# Patient Record
Sex: Female | Born: 1985 | Race: Black or African American | Hispanic: No | Marital: Single | Smoking: Never smoker
Health system: Southern US, Community
[De-identification: ages and names within clinical notes are randomized; demographics above are authoritative.]

## PROBLEM LIST (undated history)

## (undated) DIAGNOSIS — F329 Major depressive disorder, single episode, unspecified: Secondary | ICD-10-CM

## (undated) DIAGNOSIS — F32A Depression, unspecified: Secondary | ICD-10-CM

---

## 2013-03-23 ENCOUNTER — Ambulatory Visit (INDEPENDENT_AMBULATORY_CARE_PROVIDER_SITE_OTHER): Payer: 59 | Admitting: Internal Medicine

## 2013-03-23 VITALS — BP 102/75 | HR 72 | Temp 98.2°F | Resp 18 | Ht 67.5 in | Wt 360.2 lb

## 2013-03-23 DIAGNOSIS — L03111 Cellulitis of right axilla: Secondary | ICD-10-CM

## 2013-03-23 DIAGNOSIS — Z6841 Body Mass Index (BMI) 40.0 and over, adult: Secondary | ICD-10-CM

## 2013-03-23 DIAGNOSIS — IMO0002 Reserved for concepts with insufficient information to code with codable children: Secondary | ICD-10-CM

## 2013-03-23 DIAGNOSIS — L732 Hidradenitis suppurativa: Secondary | ICD-10-CM

## 2013-03-23 MED ORDER — DOXYCYCLINE HYCLATE 100 MG PO TABS: 100.0000 mg | ORAL_TABLET | Freq: Two times a day (BID) | ORAL | Status: DC

## 2013-03-23 NOTE — Progress Notes (Signed)
  Subjective:    Patient ID: Gabrielle Golden, female    DOB: 1985/09/26, 27 y.o.   MRN: [id number]  HPIprogressive swelling /pain/some pus d/c R axilla over last 4 wks Hx same L ax 12-18 mos ago req I&D---ok since  No htn or DM Morbidly large   Review of Systems No fever    Objective:   Physical Exam BP 102/75  Pulse 72  Temp(Src) 98.2 F (36.8 C)  Resp 18  Ht 5' 7.5" (1.715 m)  Wt 360 lb 3.2 oz (163.386 kg)  BMI 55.55 kg/m2  SpO2 100%  LMP 12/13/2012 Matted connected abscesses R axillae with one large area 2cmx2cm very tender 3 pointing areas but no spontan pus   See proc= S Weber      Assessment & Plan:  BMI 50.0-59.9, adult  Hidradenitis suppurativa of right axilla  Meds ordered this encounter  Medications  . doxycycline (VIBRA-TABS) 100 MG tablet    Sig: Take 1 tablet (100 mg total) by mouth 2 (two) times daily.    Dispense:  20 tablet    Refill:  0   F/u as disc

## 2013-03-26 ENCOUNTER — Ambulatory Visit (INDEPENDENT_AMBULATORY_CARE_PROVIDER_SITE_OTHER): Payer: 59 | Admitting: Physician Assistant

## 2013-03-26 VITALS — BP 120/80 | HR 81 | Temp 98.1°F | Resp 16 | Ht 67.5 in | Wt 360.0 lb

## 2013-03-26 DIAGNOSIS — L732 Hidradenitis suppurativa: Secondary | ICD-10-CM

## 2013-03-26 DIAGNOSIS — L0291 Cutaneous abscess, unspecified: Secondary | ICD-10-CM

## 2013-03-26 NOTE — Progress Notes (Signed)
   Patient ID: Gabrielle Golden MRN: [id number], DOB: 1986/01/13 27 y.o. Date of Encounter: 03/26/2013, 4:22 PM  Primary Physician: Pcp Not In System  Chief Complaint: Wound care   See previous note  HPI: 27 y.o. female presents for wound care s/p I&D on 03/24/13 Doing well No issues or complaints Afebrile/ no chills No nausea or vomiting Tolerating Doxycycline Pain improving Daily dressing change Previous note reviewed  History reviewed. No pertinent past medical history.   Home Meds: Prior to Admission medications   Medication Sig Start Date End Date Taking? Authorizing Provider  doxycycline (VIBRA-TABS) 100 MG tablet Take 1 tablet (100 mg total) by mouth 2 (two) times daily. 03/23/13  Yes Tonye Pearson, MD    Allergies:  Allergies  Allergen Reactions  . Septra [Sulfamethoxazole-Tmp Ds] Hives    ROS: Constitutional: Afebrile, no chills Cardiovascular: negative for chest pain or palpitations Dermatological: Positive for wound and improving pain. Negative for erythema or warmth  GI: No nausea or vomiting   EXAM: Physical Exam: Blood pressure 120/80, pulse 81, temperature 98.1 F (36.7 C), temperature source Oral, resp. rate 16, height 5' 7.5" (1.715 m), weight 360 lb (163.295 kg), last menstrual period 03/25/2013, SpO2 100.00%., Body mass index is 55.52 kg/(m^2). General: Well developed, well nourished, in no acute distress. Nontoxic appearing. Head: Normocephalic, atraumatic, sclera non-icteric.  Neck: Supple. Lungs: Breathing is unlabored. Heart: Normal rate. Skin:  Warm and moist. Dressing and packing in place. Small amount of induration and TTP. No erythema. Neuro: Alert and oriented X 3. Moves all extremities spontaneously. Normal gait.  Psych:  Responds to questions appropriately with a normal affect.   PROCEDURE: Dressing removed Packing removed x 2 No purulence expressed x 2 Wound bed healthy x 2 Irrigated with 1% plain lidocaine 5 cc x 2 Repacked  with 1/4 plain packing x 2 Dressing applied  LAB: Culture: Few GPC in pairs and and chains. Few GNR.   A/P: 27 y.o. female with cellulitis/abscess as above s/p I&D on 03/24/13 -Wound care per above -Continue Doxycycline -Pain well controlled -Daily dressing changes -Recheck 48 hours  Signed, Eula Listen, PA-C [facility] [place], [place] [address] [phone] 03/26/2013 4:22 PM

## 2013-03-28 ENCOUNTER — Ambulatory Visit (INDEPENDENT_AMBULATORY_CARE_PROVIDER_SITE_OTHER): Payer: 59 | Admitting: Physician Assistant

## 2013-03-28 VITALS — BP 120/72 | HR 63 | Temp 98.2°F | Resp 18 | Ht 67.5 in | Wt 357.0 lb

## 2013-03-28 DIAGNOSIS — IMO0002 Reserved for concepts with insufficient information to code with codable children: Secondary | ICD-10-CM

## 2013-03-28 DIAGNOSIS — L02419 Cutaneous abscess of limb, unspecified: Secondary | ICD-10-CM

## 2013-03-28 NOTE — Progress Notes (Signed)
Patient ID: Gabrielle Golden MRN: [id number], DOB: 1986-01-18 27 y.o. Date of Encounter: 03/28/2013, 1:48 PM  Chief Complaint: Wound care   See previous note  HPI: 27 y.o. y/o female presents for wound care s/p I&D x 2 on 03/23/13. She had 2 separate abscesses of right axilla Doing well No issues or complaints Afebrile/ no chills No nausea or vomiting Tolerating doxycycline.  Pain improved.  Daily dressing change Previous note reviewed  History reviewed. No pertinent past medical history.   Home Meds: Prior to Admission medications   Medication Sig Start Date End Date Taking? Authorizing Provider  doxycycline (VIBRA-TABS) 100 MG tablet Take 1 tablet (100 mg total) by mouth 2 (two) times daily. 03/23/13  Yes Tonye Pearson, MD    Allergies:  Allergies  Allergen Reactions  . Septra [Sulfamethoxazole-Tmp Ds] Hives    ROS: Constitutional: Afebrile, no chills Cardiovascular: negative for chest pain or palpitations Dermatological: Positive for wound. Negative for erythema, pain, or warmth.  GI: No nausea or vomiting   EXAM: Physical Exam: Blood pressure 120/72, pulse 63, temperature 98.2 F (36.8 C), temperature source Oral, resp. rate 18, height 5' 7.5" (1.715 m), weight 357 lb (161.934 kg), last menstrual period 03/25/2013, SpO2 98.00%., Body mass index is 55.06 kg/(m^2). General: Well developed, well nourished, in no acute distress. Nontoxic appearing. Head: Normocephalic, atraumatic, sclera non-icteric.  Neck: Supple. Lungs: Breathing is unlabored. Heart: Normal rate. Skin:  Warm and moist. Dressing and packing in place. No induration, erythema, or tenderness to palpation. Neuro: Alert and oriented X 3. Moves all extremities spontaneously. Normal gait.  Psych:  Responds to questions appropriately with a normal affect.       PROCEDURE: Dressing and packing removed. No purulence expressed Both wound beds healthy Both irrigated with 1% plain lidocaine 5 cc and  repacked with 1/4 plain packing.  Dressing applied  LAB: Culture: pending  A/P: 27 y.o. y/o female with axillary cellulitis/abscess as above s/p I&D on 03/23/13 Wound care per above Continue doxycycline.  Pain well controlled Daily dressing changes Recheck 48 hours  Signed, Rhoderick Moody, PA-C 03/28/2013 1:48 PM

## 2013-03-29 LAB — WOUND CULTURE: Gram Stain: NONE SEEN

## 2013-03-30 ENCOUNTER — Ambulatory Visit (INDEPENDENT_AMBULATORY_CARE_PROVIDER_SITE_OTHER): Payer: 59 | Admitting: Physician Assistant

## 2013-03-30 VITALS — BP 120/64 | HR 77 | Temp 98.1°F | Resp 18 | Wt 360.0 lb

## 2013-03-30 DIAGNOSIS — L732 Hidradenitis suppurativa: Secondary | ICD-10-CM

## 2013-03-30 NOTE — Progress Notes (Signed)
   Patient ID: Gabrielle Golden MRN: [id number], DOB: 10-Feb-1986 27 y.o. Date of Encounter: 03/30/2013, 12:36 PM  Primary Physician: Pcp Not In System  Chief Complaint: Wound care   See previous note  HPI: 27 y.o. female presents for wound care s/p I&D on 03/23/13 Doing well No issues or complaints Afebrile/ no chills No nausea or vomiting Tolerating doxycycline Pain resolved Daily dressing change Packing came out of both wounds at some point between 03/28/13 and today Previous note reviewed  History reviewed. No pertinent past medical history.   Home Meds: Prior to Admission medications   Medication Sig Start Date End Date Taking? Authorizing Provider  doxycycline (VIBRA-TABS) 100 MG tablet Take 1 tablet (100 mg total) by mouth 2 (two) times daily. 03/23/13  Yes Tonye Pearson, MD    Allergies:  Allergies  Allergen Reactions  . Septra [Sulfamethoxazole-Tmp Ds] Hives    ROS: Constitutional: Afebrile, no chills Cardiovascular: negative for chest pain or palpitations Dermatological: Positive for wound. Negative for erythema, pain, or warmth  GI: No nausea or vomiting   EXAM: Physical Exam: Blood pressure 120/64, pulse 77, temperature 98.1 F (36.7 C), temperature source Oral, resp. rate 18, weight 360 lb (163.295 kg), last menstrual period 03/25/2013, SpO2 99.00%., Body mass index is 55.52 kg/(m^2). General: Well developed, well nourished, in no acute distress. Nontoxic appearing. Head: Normocephalic, atraumatic, sclera non-icteric.  Neck: Supple. Lungs: Breathing is unlabored. Heart: Normal rate. Skin:  Warm and moist. No dressing or packing in place. No induration, erythema, or tenderness to palpation. Inferior wound resolved. Superior wound still open.  Neuro: Alert and oriented X 3. Moves all extremities spontaneously. Normal gait.  Psych:  Responds to questions appropriately with a normal affect.   PROCEDURE: Dressing removed No purulence expressed Wound  bed healthy Irrigated with 1% plain lidocaine 5 cc. Repacked with 1/4 inch plain packing Dressing applied  LAB: Culture: No growth 2 days  A/P: 27 y.o. female with improving cellulitis/abscess as above s/p I&D on 03/23/13 -Wound care per above -Continue doxycycline -Pain well controlled -Daily dressing changes -Recheck 48 hours  Signed, Eula Listen, PA-C [facility] [place], [place] [address] [phone] 03/30/2013 12:36 PM

## 2013-04-01 ENCOUNTER — Ambulatory Visit (INDEPENDENT_AMBULATORY_CARE_PROVIDER_SITE_OTHER): Payer: 59 | Admitting: Physician Assistant

## 2013-04-01 VITALS — BP 110/70 | HR 80 | Temp 99.8°F | Resp 20 | Ht 67.0 in | Wt 356.2 lb

## 2013-04-01 DIAGNOSIS — L732 Hidradenitis suppurativa: Secondary | ICD-10-CM

## 2013-04-01 DIAGNOSIS — IMO0002 Reserved for concepts with insufficient information to code with codable children: Secondary | ICD-10-CM

## 2013-04-01 DIAGNOSIS — L02419 Cutaneous abscess of limb, unspecified: Secondary | ICD-10-CM

## 2013-04-01 NOTE — Progress Notes (Signed)
  Subjective:    Patient ID: Gabrielle Golden, female    DOB: Nov 19, 1985, 27 y.o.   MRN: [id number]  HPI  This 27 y.o. female presents for evaluation of axillary abscess s/p I&D 03/23/2013. This is her 4th visit for wound care. Thinks the packing may have fallen out. Has about 2 days left of the doxycycline.  Tolerating it well with food.  No fever/chills.  Pain is very much improved.  Review of Systems As above.    Objective:   Physical Exam BP 110/70  Pulse 80  Temp(Src) 99.8 F (37.7 C) (Oral)  Resp 20  Ht 5\' 7"  (1.702 m)  Wt 356 lb 3.2 oz (161.571 kg)  BMI 55.78 kg/m2  SpO2 98%  LMP 03/25/2013 A&O x 3. Packing removed.  Wound very shallow, with beefy red tissue.  No exudate or purulence.  Some surrounding induration consistent with recent cellulitis and chronic hidradenitis.  Non-tender. No packing placed.  Adhesive dressing applied.       Assessment & Plan:  Axillary abscess  - resolved.  Continue local wound care. Anticipatory guidance provided.  Hidradenitis suppurativa of right axilla  RTC prn.  Fernande Bras, PA-C Physician Assistant-Certified [facility]

## 2013-04-01 NOTE — Patient Instructions (Signed)
Complete the antibiotic

## 2013-09-24 ENCOUNTER — Ambulatory Visit (INDEPENDENT_AMBULATORY_CARE_PROVIDER_SITE_OTHER): Payer: 59 | Admitting: Family Medicine

## 2013-09-24 VITALS — BP 126/74 | HR 88 | Temp 98.2°F | Resp 18 | Ht 66.5 in | Wt 359.0 lb

## 2013-09-24 DIAGNOSIS — L0390 Cellulitis, unspecified: Secondary | ICD-10-CM

## 2013-09-24 DIAGNOSIS — L0291 Cutaneous abscess, unspecified: Secondary | ICD-10-CM

## 2013-09-24 DIAGNOSIS — M79629 Pain in unspecified upper arm: Secondary | ICD-10-CM

## 2013-09-24 DIAGNOSIS — M79609 Pain in unspecified limb: Secondary | ICD-10-CM

## 2013-09-24 DIAGNOSIS — L732 Hidradenitis suppurativa: Secondary | ICD-10-CM

## 2013-09-24 MED ORDER — DOXYCYCLINE HYCLATE 100 MG PO TABS: 100.0000 mg | ORAL_TABLET | Freq: Two times a day (BID) | ORAL | Status: DC

## 2013-09-24 NOTE — Progress Notes (Addendum)
Subjective:    Patient ID: Gabrielle Golden, female    DOB: [birth year], 28 y.o.   MRN: [id number]  HPI  This chart was scribed for Daveda Larock-MD by Smiley HousemanFallon Davis, Scribe. This patient was seen in room 9 and the patient's care was started at 4:28 PM.  HPI Comments: Gabrielle Golden is a 28 y.o. female who [facility] complaining of multiple abscesses that she noticed about 2 weeks ago.  She states she was informed to only return if the abscesses came to a head, but states they have become extremely painful.  She denies fevers, chills, and generalized myalgias.  Pt has h/o hidradenitis and was last seen in October 2014 for a similar complaint.  She states she began having abscesses sporadically as a child, but they have increased and worsened over the past year.  Pt denies visiting a dermatologist for this complaint.  Pt denies any other medical conditions.  She is allergic to Septra.  Pt denies taking any medicaitons daily.  Pt states she is in school at [employer].  [place] originally from [place]  She has been applying warm compresses.  History reviewed. No pertinent past surgical history.  Family History  Problem Relation Age of Onset  . Cancer Maternal Grandfather     History   Social History  . Marital Status: Single    Spouse Name: N/A    Number of Children: N/A  . Years of Education: N/A   Occupational History  . Not on file.   Social History Main Topics  . Smoking status: Never Smoker   . Smokeless tobacco: Never Used  . Alcohol Use: No  . Drug Use: No  . Sexual Activity: Not on file   Other Topics Concern  . Not on file   Social History Narrative  . No narrative on file    Allergies  Allergen Reactions  . Septra [Sulfamethoxazole-Tmp Ds] Hives    Patient Active Problem List   Diagnosis Date Noted  . BMI 50.0-59.9, adult 03/23/2013  . Hidradenitis suppurativa of right axilla 03/23/2013     Review of Systems  Constitutional:  Negative for fever, chills, diaphoresis and fatigue.  Respiratory: Negative for cough and shortness of breath.   Cardiovascular: Negative for chest pain.  Gastrointestinal: Negative for nausea, vomiting, abdominal pain and diarrhea.  Musculoskeletal: Negative for myalgias.  Skin: Positive for wound. Negative for color change and rash.       Multiple abscesses.    Neurological: Negative for headaches.  Psychiatric/Behavioral: Negative for behavioral problems and confusion.       Objective:   Physical Exam  Nursing note and vitals reviewed. Constitutional: She is oriented to person, place, and time. She appears well-developed and well-nourished. No distress.  HENT:  Head: Normocephalic and atraumatic.  Eyes: EOM are normal.  Neck: Neck supple. No tracheal deviation present.  Cardiovascular: Normal rate.   Pulmonary/Chest: Effort normal. No respiratory distress.  Musculoskeletal: Normal range of motion.  Neurological: She is alert and oriented to person, place, and time.  Skin: Skin is warm and dry. No rash noted.  2.5cm x 2 cm area of fluctuance and induration in left axilla.  1 cm area of induration at the medial left breast 3 cm by 1 cm of induration, fluctuance, and tenderness in right axilla.    Psychiatric: She has a normal mood and affect. Her behavior is normal.    Filed Vitals:   09/24/13 1624  BP: 126/74  Pulse: 88  Temp: 98.2 F (36.8 C)  TempSrc: Oral  Resp: 18  Height: 5' 6.5" (1.689 m)  Weight: 359 lb (162.841 kg)  SpO2: 99%   DIAGNOSTIC STUDIES: Oxygen Saturation is 99% on RA, normal by my interpretation.    COORDINATION OF CARE: 4:40 PM-Patient informed of current plan of treatment and evaluation and agrees with plan.    PROCEDURE NOTE: SEE SEPARATE PROCEDURE NOTE BY RYAN DUNN, PA-C.    Assessment & Plan:  Pain in axilla  Abscess and cellulitis  Hidradenitis suppurativa of right axilla  1. Pain B axilla:  New.  Secondary to recurrent  infection. 2.  Abscess/cellulitis B axilla:  Recurrent; s/p I&D with packing; RTC 48 hours for follow-up.  Rx for Doxycycline 100mg  bid x 2 weeks provided. Apply warm compresses for 15-20 minutes bid. 3.  Hidradenitis suppurativa B axilla:  Recurrent issue; rx for Doxycycline provided to take for 15 days; if recurs, advised to restart Doxycycline with onset of symptoms.  Refill of Doxycycline provided.  Meds ordered this encounter  Medications  . doxycycline (VIBRA-TABS) 100 MG tablet    Sig: Take 1 tablet (100 mg total) by mouth 2 (two) times daily.    Dispense:  60 tablet    Refill:  1   I personally performed the services described in this documentation, which was scribed in my presence.  The recorded information has been reviewed and is accurate.  Nilda SimmerKristi Briyah Wheelwright, M.D.  [facility]  [place] [address] [place], [place]  [address] [phone] phone [phone] fax

## 2013-09-24 NOTE — Progress Notes (Signed)
   Patient ID: Morene AntuMonet Michon MRN: [id number], DOB: 1986/01/22, 28 y.o. Date of Encounter: 09/24/2013, 5:44 PM    PROCEDURE NOTE: Verbal consent obtained. Risks and benefits of the procedure were explained to the patient. Patient made an informed decision to proceed with the procedure. Betadine prep per usual protocol. Local anesthesia obtained with 2% plain lidocaine 2 cc.   1 cm incision made with 11 blade along each lesion. 1 lesion along each axilla was lanced.   Culture taken. Scant purulence expressed. Lesion explored revealing no loculations. Irrigated with NS.  Packed with 1/4 inch plain packing.  Dressed. Wound care instructions including precautions with patient. Warm compresses.  Patient tolerated the procedure well. Recheck in 48 hours.  Lesion along the anterior chest and posterior chest, patient to apply warm compresses.        Signed, Eula Listenyan Dunn, MHS, PA-C [facility] [place], [place] [phone] [facility] 09/24/2013 5:44 PM

## 2013-09-24 NOTE — Patient Instructions (Signed)
1. Take Doxycycline 100mg  one tablet twice daily for two weeks and then stop. 2.  If symptoms recur in the future, start Doxycycline 100mg  twice daily for two weeks ago.  Start Doxycycline with onset of symptoms to hopefully avoid needing to have lanced.

## 2013-09-26 ENCOUNTER — Ambulatory Visit (INDEPENDENT_AMBULATORY_CARE_PROVIDER_SITE_OTHER): Payer: 59 | Admitting: Physician Assistant

## 2013-09-26 VITALS — BP 122/84 | HR 79 | Temp 98.6°F | Resp 16 | Ht 66.5 in | Wt 357.0 lb

## 2013-09-26 DIAGNOSIS — L0390 Cellulitis, unspecified: Principal | ICD-10-CM

## 2013-09-26 DIAGNOSIS — L732 Hidradenitis suppurativa: Secondary | ICD-10-CM

## 2013-09-26 DIAGNOSIS — L0291 Cutaneous abscess, unspecified: Secondary | ICD-10-CM

## 2013-09-26 NOTE — Progress Notes (Signed)
   Subjective:    Patient ID: Gabrielle Golden, female    DOB: 06-09-1986, 28 y.o.   MRN: [id number]  HPI   Gabrielle Golden is a very pleasant 28 yr old female here for wound care following I&D of two axillary abscess on 09/24/13.  She reports she is doing well.  Still having drainage and tenderness. The packing has fallen out of the Right axillary lesion.  She is taking the abx and tolerating it well.  No fever, chills, NV.   Review of Systems  Constitutional: Negative for fever and chills.  Respiratory: Negative.   Cardiovascular: Negative.   Gastrointestinal: Negative.   Skin: Positive for wound.       Objective:   Physical Exam  Vitals reviewed. Constitutional: She is oriented to person, place, and time. She appears well-developed and well-nourished. No distress.  HENT:  Head: Normocephalic and atraumatic.  Eyes: Conjunctivae are normal. No scleral icterus.  Pulmonary/Chest: Effort normal.  Neurological: She is alert and oriented to person, place, and time.  Skin: Skin is warm and dry.  Packing not in place in right axilla; packing removed from left axilla; no drainage expressed from either wound; not repacked as both wounds are very small; dressing applied  Psychiatric: She has a normal mood and affect. Her behavior is normal.        Assessment & Plan:  Cellulitis and abscess  Axillary hidradenitis suppurativa   Gabrielle Golden is a pleasant 28 yr old female here for wound care.  I have not repacked either wound today.  Continue daily dressing changes until no longer draining.  Continue hot compresses.  Finish abx as directed.  RTC if concerns.    Loleta DickerE. Elizabeth Jeovani Weisenburger MHS, PA-C [facility] 4/18/20156:40 PM

## 2014-01-15 ENCOUNTER — Ambulatory Visit (INDEPENDENT_AMBULATORY_CARE_PROVIDER_SITE_OTHER): Payer: BC Managed Care – PPO | Admitting: Family Medicine

## 2014-01-15 VITALS — BP 122/80 | HR 73 | Temp 98.8°F | Resp 16 | Ht 67.0 in | Wt 357.6 lb

## 2014-01-15 DIAGNOSIS — M79609 Pain in unspecified limb: Secondary | ICD-10-CM

## 2014-01-15 DIAGNOSIS — Z131 Encounter for screening for diabetes mellitus: Secondary | ICD-10-CM

## 2014-01-15 DIAGNOSIS — M79621 Pain in right upper arm: Secondary | ICD-10-CM

## 2014-01-15 DIAGNOSIS — L732 Hidradenitis suppurativa: Secondary | ICD-10-CM

## 2014-01-15 LAB — POCT GLYCOSYLATED HEMOGLOBIN (HGB A1C): Hemoglobin A1C: 5.1

## 2014-01-15 MED ORDER — HYDROCODONE-ACETAMINOPHEN 5-325 MG PO TABS: 1.0000 | ORAL_TABLET | Freq: Four times a day (QID) | ORAL | Status: DC | PRN

## 2014-01-15 NOTE — Progress Notes (Signed)
Chief Complaint:  Chief Complaint  Patient presents with  . abcess underrt arm    1 /1/2 weeks ago    HPI: Gabrielle Golden is a 28 y.o. female who is here for 1.5 weeks of  Right axillary pain , worsening sxs in last 3 days she has been taking doxycycline and putting warm compresses on it without relief. She had this before and Gabrielle FurbishElizabeth Egan PA-C had given her a refill n doxy to use, she has also tried warm compresses without relief. She has never been tested for diabetes. She has severe pain.   History reviewed. No pertinent past medical history. History reviewed. No pertinent past surgical history. History   Social History  . Marital Status: Single    Spouse Name: N/A    Number of Children: N/A  . Years of Education: N/A   Social History Main Topics  . Smoking status: Never Smoker   . Smokeless tobacco: Never Used  . Alcohol Use: No  . Drug Use: No  . Sexual Activity: None   Other Topics Concern  . 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  From [place].   Family History  Problem Relation Age of Onset  . Cancer Maternal Grandfather    Allergies  Allergen Reactions  . Septra [Sulfamethoxazole-Tmp Ds] Hives   Prior to Admission medications   Medication Sig Start Date End Date Taking? Authorizing Provider  doxycycline (VIBRA-TABS) 100 MG tablet Take 1 tablet (100 mg total) by mouth 2 (two) times daily. 09/24/13  Yes Ethelda ChickKristi M Smith, MD     ROS: The patient denies fevers, chills, night sweats, unintentional weight loss, chest pain, palpitations, wheezing, dyspnea on exertion, nausea, vomiting, abdominal pain, dysuria, hematuria, melena, numbness, weakness, or tingling.   All other systems have been reviewed and were otherwise negative with the exception of those mentioned in the HPI and as above.    PHYSICAL EXAM: Filed Vitals:   01/15/14 1550  BP: 122/80  Pulse: 73  Temp: 98.8 F (37.1 C)  Resp: 16    Filed Vitals:   01/15/14 1550  Height: 5\' 7"  (1.702 m)  Weight: 357 lb 9.6 oz (162.206 kg)   Body mass index is 55.99 kg/(m^2).  General: Alert, no acute distress, obese AA female HEENT:  Normocephalic, atraumatic, oropharynx patent. EOMI, PERRLA. Cardiovascular:  Regular rate and rhythm, no rubs murmurs or gallops.  No Carotid bruits, radial pulse intact. No pedal edema.  Respiratory: Clear to auscultation bilaterally.  No wheezes, rales, or rhonchi.  No cyanosis, no use of accessory musculature GI: No organomegaly, abdomen is soft and non-tender, positive bowel sounds.  No masses. Skin: + right axilla tenderness, warmth, minimal fluctuance, maybe lobulated but hard to tell.  Neurologic: Facial musculature symmetric. Psychiatric: Patient is appropriate throughout our interaction. Lymphatic: No cervical lymphadenopathy Musculoskeletal: Gait intact.   LABS: Results for orders placed in visit on 01/15/14  POCT GLYCOSYLATED HEMOGLOBIN (HGB A1C)      Result Value Ref Range   Hemoglobin A1C 5.1       EKG/XRAY:   Primary read interpreted by [facility].   ASSESSMENT/PLAN: Encounter Diagnoses  Name Primary?  . Hidradenitis axillaris Yes  . Axillary pain, right   . Screening for diabetes mellitus    Continue with doxycycline Rx Norco for pain F/u prn  Gross sideeffects, risk and benefits, and alternatives of medications d/w patient. Patient is aware that all medications  have potential sideeffects and we are unable to predict every sideeffect or drug-drug interaction that may occur.  Hamilton Capri PHUONG, DO 01/15/2014 4:53 PM

## 2014-01-15 NOTE — Progress Notes (Signed)
Patient ID: Gabrielle Golden MRN: [id number], DOB: June 10, 1986, 28 y.o. Date of Encounter: 01/15/2014, 4:33 PM    PROCEDURE NOTE: Verbal consent obtained. Local anesthesia obtained with 1% lidocaine with epinephrine.  1 cm incision made with 11 blade along lesion.  No purulence expressed. Lesion explored revealing no loculations. Not packed.  Dressed. Wound care instructions including precautions with patient. Patient tolerated the procedure well. Recheck as needed.      Grier MittsSigned, Rayen Dafoe, PA-C 01/15/2014 4:33 PM

## 2014-01-15 NOTE — Patient Instructions (Signed)
Hidradenitis Suppurativa, Sweat Gland Abscess Hidradenitis suppurativa is a long lasting (chronic), uncommon disease of the sweat glands. With this, boil-like lumps and scarring develop in the groin, some times under the arms (axillae), and under the breasts. It may also uncommonly occur behind the ears, in the crease of the buttocks, and around the genitals.  CAUSES  The cause is from a blocking of the sweat glands. They then become infected. It may cause drainage and odor. It is not contagious. So it cannot be given to someone else. It most often shows up in puberty (about 10 to 26 years of age). But it may happen much later. It is similar to acne which is a disease of the sweat glands. This condition is slightly more common in African-Americans and women. SYMPTOMS   Hidradenitis usually starts as one or more red, tender, swellings in the groin or under the arms (axilla).  Over a period of hours to days the lesions get larger. They often open to the skin surface, draining clear to yellow-colored fluid.  The infected area heals with scarring. DIAGNOSIS  Your caregiver makes this diagnosis by looking at you. Sometimes cultures (growing germs on plates in the lab) may be taken. This is to see what germ (bacterium) is causing the infection.  TREATMENT   Topical germ killing medicine applied to the skin (antibiotics) are the treatment of choice. Antibiotics taken by mouth (systemic) are sometimes needed when the condition is getting worse or is severe.  Avoid tight-fitting clothing which traps moisture in.  Dirt does not cause hidradenitis and it is not caused by poor hygiene.  Involved areas should be cleaned daily using an antibacterial soap. Some patients find that the liquid form of Lever 2000, applied to the involved areas as a lotion after bathing, can help reduce the odor related to this condition.  Sometimes surgery is needed to drain infected areas or remove scarred tissue. Removal of  large amounts of tissue is used only in severe cases.  Birth control pills may be helpful.  Oral retinoids (vitamin A derivatives) for 6 to 12 months which are effective for acne may also help this condition.  Weight loss will improve but not cure hidradenitis. It is made worse by being overweight. But the condition is not caused by being overweight.  This condition is more common in people who have had acne.  It may become worse under stress. There is no medical cure for hidradenitis. It can be controlled, but not cured. The condition usually continues for years with periods of getting worse and getting better (remission). Document Released: 01/10/2004 Document Revised: 08/20/2011 Document Reviewed: 08/28/2013 ExitCare Patient Information 2015 [employer]. This information is not intended to replace advice given to you by your health care provider. Make sure you discuss any questions you have with your health care provider.  

## 2016-04-25 ENCOUNTER — Other Ambulatory Visit: Payer: Self-pay | Admitting: Nurse Practitioner

## 2016-04-25 DIAGNOSIS — R10811 Right upper quadrant abdominal tenderness: Secondary | ICD-10-CM

## 2016-04-25 DIAGNOSIS — R10812 Left upper quadrant abdominal tenderness: Secondary | ICD-10-CM

## 2016-05-02 ENCOUNTER — Other Ambulatory Visit: Payer: Self-pay

## 2016-05-11 ENCOUNTER — Ambulatory Visit
Admission: RE | Admit: 2016-05-11 | Discharge: 2016-05-11 | Disposition: A | Discharge status: Home / Self Care | Payer: BLUE CROSS/BLUE SHIELD | Source: Ambulatory Visit | Attending: Nurse Practitioner | Admitting: Nurse Practitioner

## 2016-05-11 DIAGNOSIS — R10812 Left upper quadrant abdominal tenderness: Secondary | ICD-10-CM

## 2016-05-11 DIAGNOSIS — R10811 Right upper quadrant abdominal tenderness: Secondary | ICD-10-CM

## 2017-08-27 ENCOUNTER — Other Ambulatory Visit (HOSPITAL_COMMUNITY): Payer: Self-pay | Admitting: General Surgery

## 2017-09-11 ENCOUNTER — Ambulatory Visit: Payer: BLUE CROSS/BLUE SHIELD | Admitting: Skilled Nursing Facility1

## 2017-09-24 ENCOUNTER — Ambulatory Visit: Payer: BLUE CROSS/BLUE SHIELD | Admitting: Skilled Nursing Facility1

## 2017-09-26 ENCOUNTER — Encounter (HOSPITAL_COMMUNITY): Payer: Self-pay

## 2017-09-26 ENCOUNTER — Ambulatory Visit (HOSPITAL_COMMUNITY): Payer: BLUE CROSS/BLUE SHIELD

## 2017-10-01 ENCOUNTER — Other Ambulatory Visit (HOSPITAL_COMMUNITY): Payer: Self-pay | Admitting: General Surgery

## 2017-10-08 ENCOUNTER — Encounter: Payer: Self-pay | Admitting: Skilled Nursing Facility1

## 2017-10-08 ENCOUNTER — Encounter: Payer: 59 | Attending: General Surgery | Admitting: Skilled Nursing Facility1

## 2017-10-08 DIAGNOSIS — Z6841 Body Mass Index (BMI) 40.0 and over, adult: Secondary | ICD-10-CM | POA: Diagnosis present

## 2017-10-08 DIAGNOSIS — Z713 Dietary counseling and surveillance: Secondary | ICD-10-CM | POA: Insufficient documentation

## 2017-10-08 NOTE — Progress Notes (Addendum)
Pre-Op Assessment Visit:  Pre-Operative Sleeve Gastrectomy Surgery  Medical Nutrition Therapy:  Appt start time:  3:15  End time:  4:11  Patient was seen on 10/08/2017 for Pre-Operative Nutrition Assessment. Assessment and letter of approval faxed to [facility] Bariatric Surgery Program coordinator on 10/08/2017.   Pt states surgery was not her first choice but feels she needs it. Pt states she is going to work on her relationship with food and create the behavior change prior to surgery so she can be successful after surgery.  Needs 6 months SWL.  Pt states she will work on making meals from home and eating every 3 hours.   Pt expectation of surgery: to help with portion control  Pt expectation of Dietitian: education and debunking myths and other options as a lifestyle   Start weight at [facility]: 397.1 BMI: 62.19  24 hr Dietary Recall: First Meal: skipped Snack: candy or granola bar Second Meal 1:30-2pm: fast food Snack: candy or granola bar Third Meal 5pm-10pm: fas food  Snack: chips and yogurt Beverages: soda, water, lemonade, juice, almond milk  Encouraged to engage in 150 minutes of moderate physical activity including cardiovascular and weight baring weekly  Handouts given during visit include:  . Should I eat . Meal ideas During the appointment today the following Pre-Op Goals were reviewed with the patient: . Maintain or lose weight as instructed by your surgeon . Make healthy food choices . Begin to limit portion sizes . Limited concentrated sugars and fried foods . Keep fat/sugar in the single digits per serving on             food labels . Practice CHEWING your food  (aim for 30 chews per bite or until applesauce consistency) . Practice not drinking 15 minutes before, during, and 30 minutes after each meal/snack . Avoid all carbonated beverages  . Avoid/limit caffeinated beverages  . Avoid all sugar-sweetened beverages . Consume 3 meals per day; eat  every 3-5 hours . Make a list of non-food related activities . Aim for 64-100 ounces of FLUID daily  . Aim for at least 60-80 grams of PROTEIN daily . Look for a liquid protein source that contain ?15 g protein and ?5 g carbohydrate  (ex: shakes, drinks, shots)  -Follow diet recommendations listed below   Energy and Macronutrient Recomendations: Calories: 1500 Carbohydrate: 170 Protein: 112 Fat: 42  Demonstrated degree of understanding via:  Teach Back  Teaching Method Utilized:  Visual Auditory Hands on  Barriers to learning/adherence to lifestyle change: none identified   Patient to call the [facility] to enroll in Pre-Op and Post-Op Nutrition Education when surgery date is scheduled.

## 2017-10-16 ENCOUNTER — Ambulatory Visit (HOSPITAL_COMMUNITY)
Admission: RE | Admit: 2017-10-16 | Discharge: 2017-10-16 | Disposition: A | Discharge status: Home / Self Care | Payer: 59 | Source: Ambulatory Visit | Attending: General Surgery | Admitting: General Surgery

## 2017-10-16 ENCOUNTER — Other Ambulatory Visit: Payer: Self-pay

## 2017-10-16 DIAGNOSIS — Z01818 Encounter for other preprocedural examination: Secondary | ICD-10-CM | POA: Insufficient documentation

## 2017-11-05 ENCOUNTER — Encounter: Payer: 59 | Attending: General Surgery | Admitting: Skilled Nursing Facility1

## 2017-11-05 ENCOUNTER — Encounter: Payer: Self-pay | Admitting: Skilled Nursing Facility1

## 2017-11-05 DIAGNOSIS — Z713 Dietary counseling and surveillance: Secondary | ICD-10-CM | POA: Diagnosis not present

## 2017-11-05 DIAGNOSIS — Z6841 Body Mass Index (BMI) 40.0 and over, adult: Secondary | ICD-10-CM

## 2017-11-05 NOTE — Patient Instructions (Signed)
-  Turn a negative thought into a positive thought: challenge a negative thought   -Aim to have 3 meals a day: I will plan my meals today and I will grocery shop today, I will prepare enough for tonight and tomorrow   -Aim to have fast food no more than 3 times a week

## 2017-11-05 NOTE — Progress Notes (Signed)
Sleeve Assessment:   1st SWL Appointment.   Pt states surgery was not her first choice but feels she needs it. Pt states she is going to work on her relationship with food and create the behavior change prior to surgery so she can be successful after surgery.  Needs 6 months SWL  Pt arrived having gained about 8 pounds. Pt states she did not follow the plan at all and did not plan appropriately to be successful. Diagnosed with depression and used food to cope. Recognize unhealthy behaviors caring about weight gain. Pt states she is working with her therapist on how to aford her exercise option.    Start weight at [facility]: 397.1 Wt Today: 405.3 BMI: 62.19   MEDICATIONS: See List   DIETARY INTAKE:  24 hr Dietary Recall: First Meal: skipped Snack: candy or granola bar Second Meal 1:30-2pm: fast food Snack: candy or granola bar Third Meal 5pm-10pm: fas food  Snack: chips and yogurt Beverages: soda, water, lemonade, juice, almond milk  Usual physical activity: ADL's  Energy and Macronutrient Recomendations: Calories: 1500 Carbohydrate: 170 Protein: 112 Fat: 42  Nutritional Diagnosis:  [place]-3.3 Overweight/obesity related to past poor dietary habits and physical inactivity as evidenced by patient w/ planned sleeve gastrectomy surgery following dietary guidelines for continued weight loss.    Intervention:  Nutrition counseling for upcoming Bariatric Surgery. Goals: -Encouraged to engage in 150 minutes of moderate physical activity including cardiovascular and weight baring weekly -Turn a negative thought into a positive thought: challenge a negative thought  -Aim to have 3 meals a day: I will plan my meals today and I will grocery shop today, I will prepare enough for tonight and tomorrow  -Aim to have fast food no more than 3 times a week Teaching Method Utilized:  Visual Auditory Hands on  Barriers to learning/adherence to lifestyle change: emotional eating  Demonstrated  degree of understanding via:  Teach Back   Monitoring/Evaluation:  Dietary intake, exerciseand body weight prn.

## 2017-12-05 ENCOUNTER — Encounter: Payer: 59 | Attending: General Surgery | Admitting: Skilled Nursing Facility1

## 2017-12-05 ENCOUNTER — Encounter: Payer: Self-pay | Admitting: Skilled Nursing Facility1

## 2017-12-05 DIAGNOSIS — Z6841 Body Mass Index (BMI) 40.0 and over, adult: Secondary | ICD-10-CM | POA: Diagnosis present

## 2017-12-05 DIAGNOSIS — Z713 Dietary counseling and surveillance: Secondary | ICD-10-CM | POA: Insufficient documentation

## 2017-12-05 NOTE — Progress Notes (Signed)
Sleeve Assessment:  2nd SWL Appointment.   Pt states surgery was not her first choice but feels she needs it. Pt states she is going to work on her relationship with food and create the behavior change prior to surgery so she can be successful after surgery.  Needs 6 months SWL  Pt arrived having lost about 3 pounds Pt states she had a family member pass away and Handled the stress with food. Pt states he was doing whole 30 to "reset" along with intermittent fasting trying to break late night eating. Pt states she Feels like she has to have restriction in her eating. Pt states her Skin was clearing up. Pt states she was anxious about coming in today because she knows she did not follow the dietitians recommendations: Dietitian reassured the pt not all people have the same path to success and she cannot dictate what that path will look like. Pt states she struggles with really wanting to be freed from food anxiety right now, wanting that better relationship with food right now.   Dietitian educated the pt on time being a barrier as well as diet mentality.   Start weight at [facility]: 397.1 Wt Today: 402 BMI: 62.19  MEDICATIONS: See List   DIETARY INTAKE:  24 hr Dietary Recall: First Meal: skipped Snack: candy or granola bar Second Meal 1:30-2pm: fast food Snack: candy or granola bar Third Meal 5pm-10pm: fas food  Snack: chips and yogurt Beverages: soda, water, lemonade, juice, almond milk  Usual physical activity: ADL's  Energy and Macronutrient Recomendations: Calories: 1500 Carbohydrate: 170 Protein: 112 Fat: 42  Nutritional Diagnosis:  [place]-3.3 Overweight/obesity related to past poor dietary habits and physical inactivity as evidenced by patient w/ planned sleeve gastrectomy surgery following dietary guidelines for continued weight loss.    Intervention:  Nutrition counseling for upcoming Bariatric Surgery. Goals: -Encouraged to engage in 150 minutes of moderate physical activity  including cardiovascular and weight baring weekly -Eat every 3-5 hours not worrying about what or how much starting about 1-1.5 hours after waking Teaching Method Utilized:  Visual Auditory Hands on  Barriers to learning/adherence to lifestyle change: emotional eating  Demonstrated degree of understanding via:  Teach Back   Monitoring/Evaluation:  Dietary intake, exerciseand body weight prn.

## 2018-01-02 ENCOUNTER — Ambulatory Visit: Payer: 59 | Admitting: Skilled Nursing Facility1

## 2018-01-07 ENCOUNTER — Encounter: Payer: Self-pay | Admitting: Skilled Nursing Facility1

## 2018-01-07 ENCOUNTER — Encounter: Payer: 59 | Attending: General Surgery | Admitting: Skilled Nursing Facility1

## 2018-01-07 DIAGNOSIS — Z6841 Body Mass Index (BMI) 40.0 and over, adult: Secondary | ICD-10-CM | POA: Diagnosis present

## 2018-01-07 DIAGNOSIS — Z713 Dietary counseling and surveillance: Secondary | ICD-10-CM | POA: Insufficient documentation

## 2018-01-07 NOTE — Progress Notes (Signed)
Sleeve Assessment: 3rd SWL Appointment.   Pt states surgery was not her first choice but feels she needs it. Pt states she is going to work on her relationship with food and create the behavior change prior to surgery so she can be successful after surgery.  Needs 6 months SWL  Pt arrives having lost about 3 pounds. Pt states she has started eating breakfast and has noticed she is hungrier sooner and working on eating every 3 hours and has started bringing her lunch and snacks to work. Pt stated she has been started on birth control medication. Pt states she has been working on knowing if she is hungry or thirsty. Pt sates she has been preparing to move and making her budget which made her realize she cannot afford eating out as much; because she has not been eating out she has not been drinking soda. Pt states she realized she was missing vegetables so she added it to her dinner. Pt seems to get to understand the difference between appetite an hunger have to eat it now. Pt states she has decided not to buy the scale because she is trying to fully trust in the system and understand food is good. Pt states she found herself repeating to her friend who is also on this health journey to eat more often throughout the day and all food is good.  Pt states she created a goal for herself: goal for august walking 60 minutes and drink 64 ounces of water a day realizing she is bored a lot of the time and if she is out walking she is not bored eating.    Start weight at [facility]: 397.1 Wt Today: 399.4 BMI: 62.55  MEDICATIONS: See List   DIETARY INTAKE:  24 hr Dietary Recall: First Meal: eggs and grits Snack: fruit cup Second Meal 1:30-2pm: chicken salad and chips Snack: candy or granola bar Third Meal 5pm-10pm: dirty rice with kale  Snack: chips and yogurt Beverages: water, lemonade, juice, almond milk  Usual physical activity: ADL's  Energy and Macronutrient Recomendations: Calories:  1500 Carbohydrate: 170 Protein: 112 Fat: 42  Nutritional Diagnosis:  [place]-3.3 Overweight/obesity related to past poor dietary habits and physical inactivity as evidenced by patient w/ planned sleeve gastrectomy surgery following dietary guidelines for continued weight loss.    Intervention:  Nutrition counseling for upcoming Bariatric Surgery. Goals: -Encouraged to engage in 150 minutes of moderate physical activity including cardiovascular and weight baring weekly -walk 1 hour -Drink 64 ounces of water Teaching Method Utilized:  Visual Auditory Hands on  Barriers to learning/adherence to lifestyle change: emotional eating  Demonstrated degree of understanding via:  Teach Back   Monitoring/Evaluation:  Dietary intake, exerciseand body weight prn.

## 2018-01-30 ENCOUNTER — Encounter: Payer: 59 | Attending: General Surgery | Admitting: Skilled Nursing Facility1

## 2018-01-30 ENCOUNTER — Encounter: Payer: Self-pay | Admitting: Skilled Nursing Facility1

## 2018-01-30 DIAGNOSIS — Z6841 Body Mass Index (BMI) 40.0 and over, adult: Secondary | ICD-10-CM | POA: Diagnosis present

## 2018-01-30 DIAGNOSIS — Z713 Dietary counseling and surveillance: Secondary | ICD-10-CM | POA: Insufficient documentation

## 2018-01-30 NOTE — Progress Notes (Signed)
Sleeve Assessment: 4th SWL Appointment.   Pt states surgery was not her first choice but feels she needs it. Pt states she is going to work on her relationship with food and create the behavior change prior to surgery so she can be successful after surgery.  Needs 6 months SWL   Pt states this month could have been better. Pt states she is trying to get overall healthier. Pt states she has had more heavy menses. Pt states she is still not eating out but feels she back slid a little and has moved so she is in a transition period. Pt states she has gone shopping and will get back into her changes. Pt states she is still struggling with the concept of weight thinking about it every day. Pt state she is really afraid of moving backwards.  Pt was tearful throughout the appt.  Success: going more often to the grocery store, constantly trying the break the process of food is bad   Start weight at [facility]: 397.1 Wt Today: 401.5 BMI: 62.88  MEDICATIONS: See List   DIETARY INTAKE:  24 hr Dietary Recall: First Meal: eggs and grits Snack: fruit cup Second Meal 1:30-2pm: chicken salad and chips Snack: candy or granola bar Third Meal 5pm-10pm: dirty rice with kale  Snack: chips and yogurt Beverages: water, lemonade, juice, almond milk  Usual physical activity: ADL's  Energy and Macronutrient Recomendations: Calories: 1500 Carbohydrate: 170 Protein: 112 Fat: 42  Nutritional Diagnosis:  [place]-3.3 Overweight/obesity related to past poor dietary habits and physical inactivity as evidenced by patient w/ planned sleeve gastrectomy surgery following dietary guidelines for continued weight loss.    Intervention:  Nutrition counseling for upcoming Bariatric Surgery. Goals: -Encouraged to engage in 150 minutes of moderate physical activity including cardiovascular and weight baring weekly -walk 1 hour -Drink 64 ounces of water - slow down and recognize victories : at the end of each day reflect and  write down the victories: you will find one each day Teaching Method Utilized:  Visual Auditory Hands on  Barriers to learning/adherence to lifestyle change: emotional eating  Demonstrated degree of understanding via:  Teach Back   Monitoring/Evaluation:  Dietary intake, exerciseand body weight prn.

## 2018-02-27 ENCOUNTER — Encounter: Payer: Self-pay | Admitting: Skilled Nursing Facility1

## 2018-02-27 ENCOUNTER — Encounter: Payer: 59 | Attending: General Surgery | Admitting: Skilled Nursing Facility1

## 2018-02-27 DIAGNOSIS — Z6841 Body Mass Index (BMI) 40.0 and over, adult: Secondary | ICD-10-CM | POA: Diagnosis present

## 2018-02-27 DIAGNOSIS — Z713 Dietary counseling and surveillance: Secondary | ICD-10-CM | POA: Diagnosis not present

## 2018-02-27 NOTE — Progress Notes (Signed)
Sleeve Assessment: 5th SWL Appointment.   Pt states surgery was not her first choice but feels she needs it. Pt states she is going to work on her relationship with food and create the behavior change prior to surgery so she can be successful after surgery.  Needs 6 months SWL  Pt states she started birth control to regulate her cycles but it has not been helpful. Pt states she wants to find another gynecologist to help her on this journey. Pt states she has had emense menstrual pain. 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 Pt states she has been snacking throughout the day instead of a meal.  She believes more often now recognizing it is a journey. Noticing more often when she is being hard on herself."trying to give myself grace". 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    Start weight at [facility]: 397.1 Wt Today: 405.8 BMI: 63.53  MEDICATIONS: See List   DIETARY INTAKE:  24 hr Dietary Recall: First Meal: eggs and grits Snack: fruit cup Second Meal 1:30-2pm: chicken salad and chips Snack: candy or granola bar Third Meal 5pm-10pm: dirty rice with kale  Snack: chips and yogurt Beverages: water, lemonade, juice, almond milk  Usual physical activity: ADL's  Energy and Macronutrient Recomendations: Calories: 1500 Carbohydrate: 170 Protein: 112 Fat: 42  Nutritional Diagnosis:  [place]-3.3 Overweight/obesity related to past poor dietary habits and physical inactivity as evidenced by patient w/ planned sleeve gastrectomy surgery following dietary guidelines for continued weight loss.    Intervention:  Nutrition counseling for upcoming Bariatric Surgery. Goals: -Encouraged to engage in 150 minutes of moderate physical activity including cardiovascular and weight baring weekly -create a mason jar of non-food rewards (walking in the jar) -Aim to eat when you recognize you are hungry -Eat  breakfast -Bring your lunch to work -Eat every 3-5 hours  Teaching Method Utilized:  Scientific laboratory technicianVisual Auditory Hands on  Barriers to learning/adherence to lifestyle change: emotional eating  Demonstrated degree of understanding via:  Teach Back   Monitoring/Evaluation:  Dietary intake, exerciseand body weight prn.

## 2018-03-25 ENCOUNTER — Encounter: Payer: Self-pay | Admitting: Skilled Nursing Facility1

## 2018-03-25 ENCOUNTER — Encounter: Payer: 59 | Attending: General Surgery | Admitting: Skilled Nursing Facility1

## 2018-03-25 ENCOUNTER — Ambulatory Visit: Payer: 59 | Admitting: Skilled Nursing Facility1

## 2018-03-25 DIAGNOSIS — Z713 Dietary counseling and surveillance: Secondary | ICD-10-CM | POA: Insufficient documentation

## 2018-03-25 DIAGNOSIS — Z6841 Body Mass Index (BMI) 40.0 and over, adult: Secondary | ICD-10-CM | POA: Insufficient documentation

## 2018-03-25 NOTE — Progress Notes (Signed)
Sleeve Assessment: 6th SWL Appointment.   Pt has been working on her relationship with food and create the behavior change prior to surgery so she can be successful after surgery.  Needs 6 months SWL  Pt arrives having gained about 5 pounds. Pt states she has been consistent with cooking more and eating at home. Pt states she has been doing video diaries instead of writing in a diary. Pt arrives stating she feels really good and feel she has come a long way with her relationship with food.   Start weight at [facility]: 397.1 Wt Today: 410.3 BMI: 64.26  MEDICATIONS: See List   DIETARY INTAKE:  24 hr Dietary Recall: First Meal: eggs and grits or english muffin with egg cheese and [place] bacon and sometimes yogurt and 8 ounces orange juice Snack: fruit cup or banana or pretzels Second Meal 1:30-2pm: chicken salad and chips or sandwich and chips and lipton green tea Snack:  Third Meal 5pm-10pm: dirty rice with kale and chicken with lipton or water Snack: chips and yogurt Beverages: water, lemonade, juice, almond milk  Usual physical activity: ADL's  Energy and Macronutrient Recomendations: Calories: 1500 Carbohydrate: 170 Protein: 112 Fat: 42  Nutritional Diagnosis:  [place]-3.3 Overweight/obesity related to past poor dietary habits and physical inactivity as evidenced by patient w/ planned sleeve gastrectomy surgery following dietary guidelines for continued weight loss.    Intervention:  Nutrition counseling for upcoming Bariatric Surgery. Goals: -Encouraged to engage in 150 minutes of moderate physical activity including cardiovascular and weight baring weekly -Work on not drinking juice -Start walking 1 mile a day -Keep up the great work! Teaching Method Utilized:  Visual Auditory Hands on  Barriers to learning/adherence to lifestyle change: emotional eating  Demonstrated degree of understanding via:  Teach Back   Monitoring/Evaluation:  Dietary intake, exerciseand body weight  prn.

## 2018-04-07 ENCOUNTER — Encounter: Payer: 59 | Admitting: Skilled Nursing Facility1

## 2018-04-07 ENCOUNTER — Encounter: Payer: Self-pay | Admitting: Skilled Nursing Facility1

## 2018-04-07 DIAGNOSIS — E669 Obesity, unspecified: Secondary | ICD-10-CM

## 2018-04-07 DIAGNOSIS — Z713 Dietary counseling and surveillance: Secondary | ICD-10-CM | POA: Diagnosis not present

## 2018-04-07 NOTE — Progress Notes (Signed)
Pre-Operative Nutrition Class:  Appt start time: 5188   End time:  1830.  Patient was seen on 04/07/2018 for Pre-Operative Bariatric Surgery [facility]   Surgery date: Surgery type: sleeve Start weight at [facility]: 397.1 Weight today: 408.5  Samples given per MNT protocol. Patient educated on appropriate usage: Bariatric Advantage Multivitamin Lot # [id number] Exp: 10/20  Bariatric Advantage Calcium  Lot # [id number] Exp:aug-23-2020  [place] Protein Powder  Shake Lot # [id number] Exp: march 8 20  The following the learning objectives were met by the patient during this course:  Identify Pre-Op Dietary Goals and will begin 2 weeks pre-operatively  Identify appropriate sources of fluids and proteins   State protein recommendations and appropriate sources pre and post-operatively  Identify Post-Operative Dietary Goals and will follow for 2 weeks post-operatively  Identify appropriate multivitamin and calcium sources  Describe the need for physical activity post-operatively and will follow MD recommendations  State when to call healthcare provider regarding medication questions or post-operative complications  Handouts given during class include:  Pre-Op Bariatric Surgery Diet Handout  Protein Shake Handout  Post-Op Bariatric Surgery Nutrition Handout  BELT Program Information Flyer  Support Group Information Flyer  [facility] Bariatric Supplements Price List  [facility] Plan: [facility] per MD.

## 2018-06-02 ENCOUNTER — Telehealth: Payer: Self-pay | Admitting: Skilled Nursing Facility1

## 2018-06-02 NOTE — Telephone Encounter (Signed)
Dietitian called pt to assess their understanding of the pre-op nutrition recommendations through the teach back method to ensure the pts knowledge readiness in preparation for surgery.    Pt had no questions and feels prepared for surgery.

## 2018-06-25 ENCOUNTER — Ambulatory Visit: Payer: Self-pay | Admitting: General Surgery

## 2018-07-09 ENCOUNTER — Telehealth: Payer: Self-pay | Admitting: Skilled Nursing Facility1

## 2018-07-09 NOTE — Telephone Encounter (Signed)
Dietitian returned pts missed call.  LVM 

## 2018-07-11 NOTE — Patient Instructions (Signed)
Gabrielle Golden  07/11/2018   Your procedure is scheduled on: 07-21-2018   [facility]     Report to admitting at 5:30AM    Call this number if you have problems the morning of surgery [phone]      Remember: NO SOLID FOOD AFTER MIDNIGHT THE NIGHT PRIOR TO SURGERY. NOTHING BY MOUTH EXCEPT CLEAR LIQUIDS UNTIL 3 HOURS PRIOR TO SCHEULED SURGERY. PLEASE FINISH ENSURE DRINK PER SURGEON ORDER 3 HOURS PRIOR TO SCHEDULED SURGERY TIME WHICH NEEDS TO BE COMPLETED AT _____4:30AM_____.   BRUSH YOUR TEETH MORNING OF SURGERY AND RINSE YOUR MOUTH OUT, NO CHEWING GUM CANDY OR MINTS.      CLEAR LIQUID DIET   Foods Allowed                                                                     Foods Excluded  Coffee and tea, regular and decaf                             liquids that you cannot  Plain Jell-O in any flavor                                             see through such as: Fruit ices (not with fruit pulp)                                     milk, soups, orange juice  Iced Popsicles                                    All solid food Carbonated beverages, regular and diet                                    Cranberry, grape and apple juices Sports drinks like Gatorade Lightly seasoned clear broth or consume(fat free) Sugar, honey syrup  Sample Menu Breakfast                                Lunch                                     Supper Cranberry juice                    Beef broth                            Chicken broth Jell-O  Grape juice                           Apple juice Coffee or tea                        Jell-O                                      Popsicle                                                Coffee or tea                        Coffee or tea  _____________________________________________________________________       Take these medicines the morning of surgery with A SIP OF  WATER: NONE                                You may not have any metal on your body including hair pins and              piercings  Do not wear jewelry, make-up, lotions, powders or perfumes, deodorant             Do not wear nail polish.  Do not shave  48 hours prior to surgery.            Do not bring valuables to the hospital. [facility] IS NOT             RESPONSIBLE   FOR VALUABLES.  Contacts, dentures or bridgework may not be worn into surgery.  Leave suitcase in the car. After surgery it may be brought to your room.                  Please read over the following fact sheets you were given: _____________________________________________________________________             [facility] - Preparing for Surgery Before surgery, you can play an important role.  Because skin is not sterile, your skin needs to be as free of germs as possible.  You can reduce the number of germs on your skin by washing with CHG (chlorahexidine gluconate) soap before surgery.  CHG is an antiseptic cleaner which kills germs and bonds with the skin to continue killing germs even after washing. Please DO NOT use if you have an allergy to CHG or antibacterial soaps.  If your skin becomes reddened/irritated stop using the CHG and inform your nurse when you arrive at Short Stay. Do not shave (including legs and underarms) for at least 48 hours prior to the first CHG shower.  You may shave your face/neck. Please follow these instructions carefully:  1.  Shower with CHG Soap the night before surgery and the  morning of Surgery.  2.  If you choose to wash your hair, wash your hair first as usual with your  normal  shampoo.  3.  After you shampoo, rinse your hair and body thoroughly to remove the  shampoo.  4.  Use CHG as you would any other liquid soap.  You can apply chg directly  to the skin and wash                       Gently with a scrungie or clean washcloth.  5.  Apply the CHG Soap  to your body ONLY FROM THE NECK DOWN.   Do not use on face/ open                           Wound or open sores. Avoid contact with eyes, ears mouth and genitals (private parts).                       Wash face,  Genitals (private parts) with your normal soap.             6.  Wash thoroughly, paying special attention to the area where your surgery  will be performed.  7.  Thoroughly rinse your body with warm water from the neck down.  8.  DO NOT shower/wash with your normal soap after using and rinsing off  the CHG Soap.                9.  Pat yourself dry with a clean towel.            10.  Wear clean pajamas.            11.  Place clean sheets on your bed the night of your first shower and do not  sleep with pets. Day of Surgery : Do not apply any lotions/deodorants the morning of surgery.  Please wear clean clothes to the hospital/surgery center.  FAILURE TO FOLLOW THESE INSTRUCTIONS MAY RESULT IN THE CANCELLATION OF YOUR SURGERY PATIENT SIGNATURE_________________________________  NURSE SIGNATURE__________________________________  ________________________________________________________________________   Gabrielle Golden  An incentive spirometer is a tool that can help keep your lungs clear and active. This tool measures how well you are filling your lungs with each breath. Taking long deep breaths may help reverse or decrease the chance of developing breathing (pulmonary) problems (especially infection) following:  A long period of time when you are unable to move or be active. BEFORE THE PROCEDURE   If the spirometer includes an indicator to show your best effort, your nurse or respiratory therapist will set it to a desired goal.  If possible, sit up straight or lean slightly forward. Try not to slouch.  Hold the incentive spirometer in an upright position. INSTRUCTIONS FOR USE  1. Sit on the edge of your bed if possible, or sit up as far as you can in bed or on a  chair. 2. Hold the incentive spirometer in an upright position. 3. Breathe out normally. 4. Place the mouthpiece in your mouth and seal your lips tightly around it. 5. Breathe in slowly and as deeply as possible, raising the piston or the ball toward the top of the column. 6. Hold your breath for 3-5 seconds or for as long as possible. Allow the piston or ball to fall to the bottom of the column. 7. Remove the mouthpiece from your mouth and breathe out normally. 8. Rest for a few seconds and repeat Steps 1 through 7 at least 10 times every 1-2 hours when you are awake. Take your time and take a few normal breaths between deep breaths. 9. The spirometer may include an indicator to  show your best effort. Use the indicator as a goal to work toward during each repetition. 10. After each set of 10 deep breaths, practice coughing to be sure your lungs are clear. If you have an incision (the cut made at the time of surgery), support your incision when coughing by placing a pillow or rolled up towels firmly against it. Once you are able to get out of bed, walk around indoors and cough well. You may stop using the incentive spirometer when instructed by your caregiver.  RISKS AND COMPLICATIONS  Take your time so you do not get dizzy or light-headed.  If you are in pain, you may need to take or ask for pain medication before doing incentive spirometry. It is harder to take a deep breath if you are having pain. AFTER USE  Rest and breathe slowly and easily.  It can be helpful to keep track of a log of your progress. Your caregiver can provide you with a simple table to help with this. If you are using the spirometer at home, follow these instructions: [place] IF:   You are having difficultly using the spirometer.  You have trouble using the spirometer as often as instructed.  Your pain medication is not giving enough relief while using the spirometer.  You develop fever of 100.5 F  (38.1 C) or higher. SEEK IMMEDIATE MEDICAL CARE IF:   You cough up bloody sputum that had not been present before.  You develop fever of 102 F (38.9 C) or greater.  You develop worsening pain at or near the incision site. MAKE SURE YOU:   Understand these instructions.  Will watch your condition.  Will get help right away if you are not doing well or get worse. Document Released: 10/08/2006 Document Revised: 08/20/2011 Document Reviewed: 12/09/2006 [facility] Patient Information 2014 [place], [place].   ________________________________________________________________________

## 2018-07-11 NOTE — Progress Notes (Addendum)
Ekg, cxr  10-16-17 epic   lov with pcp Dr. Leavy Cella 04-03-18 epic

## 2018-07-14 ENCOUNTER — Other Ambulatory Visit: Payer: Self-pay

## 2018-07-14 ENCOUNTER — Encounter (HOSPITAL_COMMUNITY): Payer: Self-pay | Admitting: Emergency Medicine

## 2018-07-14 ENCOUNTER — Encounter (HOSPITAL_COMMUNITY)
Admission: RE | Admit: 2018-07-14 | Discharge: 2018-07-14 | Disposition: A | Discharge status: Home / Self Care | Payer: 59 | Source: Ambulatory Visit | Attending: General Surgery | Admitting: General Surgery

## 2018-07-14 DIAGNOSIS — Z01812 Encounter for preprocedural laboratory examination: Secondary | ICD-10-CM | POA: Insufficient documentation

## 2018-07-14 HISTORY — DX: Morbid (severe) obesity due to excess calories: E66.01

## 2018-07-14 HISTORY — DX: Depression, unspecified: F32.A

## 2018-07-14 HISTORY — DX: Major depressive disorder, single episode, unspecified: F32.9

## 2018-07-14 LAB — COMPREHENSIVE METABOLIC PANEL
ALT: 18 U/L (ref 0–44)
ANION GAP: 8 (ref 5–15)
AST: 22 U/L (ref 15–41)
Albumin: 3.9 g/dL (ref 3.5–5.0)
Alkaline Phosphatase: 60 U/L (ref 38–126)
BUN: 21 mg/dL — ABNORMAL HIGH (ref 6–20)
CO2: 23 mmol/L (ref 22–32)
Calcium: 8.9 mg/dL (ref 8.9–10.3)
Chloride: 105 mmol/L (ref 98–111)
Creatinine, Ser: 0.94 mg/dL (ref 0.44–1.00)
GFR calc Af Amer: >60 mL/min (ref >60)
GFR calc non Af Amer: >60 mL/min (ref >60)
Glucose, Bld: 90 mg/dL (ref 70–99)
Potassium: 3.8 mmol/L (ref 3.5–5.1)
Sodium: 136 mmol/L (ref 135–145)
Total Bilirubin: 0.5 mg/dL (ref 0.3–1.2)
Total Protein: 7.9 g/dL (ref 6.5–8.1)

## 2018-07-14 LAB — CBC WITH DIFFERENTIAL/PLATELET
Abs Immature Granulocytes: 0.03 10*3/uL (ref 0.00–0.07)
Basophils Absolute: 0.1 10*3/uL (ref 0.0–0.1)
Basophils Relative: 1 %
EOS PCT: 1 %
Eosinophils Absolute: 0.1 10*3/uL (ref 0.0–0.5)
HCT: 41 % (ref 36.0–46.0)
Hemoglobin: 13 g/dL (ref 12.0–15.0)
Immature Granulocytes: 0 %
Lymphocytes Relative: 25 %
Lymphs Abs: 2.8 10*3/uL (ref 0.7–4.0)
MCH: 26.4 pg (ref 26.0–34.0)
MCHC: 31.7 g/dL (ref 30.0–36.0)
MCV: 83.3 fL (ref 80.0–100.0)
Monocytes Absolute: 1.2 10*3/uL — ABNORMAL HIGH (ref 0.1–1.0)
Monocytes Relative: 11 %
Neutro Abs: 6.8 10*3/uL (ref 1.7–7.7)
Neutrophils Relative %: 62 %
Platelets: 379 10*3/uL (ref 150–400)
RBC: 4.92 MIL/uL (ref 3.87–5.11)
RDW: 16.3 % — AB (ref 11.5–15.5)
WBC: 11 10*3/uL — AB (ref 4.0–10.5)
nRBC: 0 % (ref 0.0–0.2)

## 2018-07-15 LAB — ABO/RH: ABO/RH(D): A NEG

## 2018-07-18 NOTE — H&P (Signed)
History of Present Illness Gabrielle Golden T. Gerrell Tabet MD; 07/02/2018 5:20 PM) The patient is a 33 year old female who presents with obesity. She returns for her preoperative visit prior to planned laparoscopic sleeve gastrectomy for surgical treatment for morbid obesity. The patient gives a history of progressive obesity since childhood despite multiple attempts at medical management. She has been through innumerable efforts at diet and exercise programs, calorie counting and supervised efforts at weight loss and has been able to lose up to 60 pounds at a time but then experienced progressive weight regain. Her current weight of 384 pounds and BMI of 60 is near her maximum. Obesity has been affecting the patient in a number of ways including progressive difficulties with routine daily activities including climbing stairs and routine hygiene which is difficult and ongoing fatigue and shortness of breath with exertion.. She has remained remarkably free of significant comorbidities but is very concerned about her long-term health S there are multiple obesity-related illnesses on her mother's side including cancers, heart disease, diabetes, high blood pressure, stroke and others. She has successfully completed her preoperative workup. No concerns on psychological nutrition evaluation. Lab work and upper GI series were unremarkable.   Problem List/Past Medical Gabrielle Golden T. Decklyn Hornik, MD; 07/02/2018 5:20 PM) MORBID OBESITY, UNSPECIFIED OBESITY TYPE (E66.01)   Past Surgical History Gabrielle Golden T. Jalisa Sacco, MD; 07/02/2018 5:20 PM) No pertinent past surgical history   Diagnostic Studies History Gabrielle Golden T. Bernabe Dorce, MD; 07/02/2018 5:20 PM) Colonoscopy  never Pap Smear  1-5 years ago  Allergies Santiago Glad, CMA; 07/02/2018 5:02 PM) Septra *ANTI-INFECTIVE AGENTS - MISC.*  Hives. Allergies Reconciled   Medication History Santiago Glad, CMA; 07/02/2018 5:02 PM) No Current Medications Medications  Reconciled  Social History Gabrielle Golden T. Thurmon Mizell, MD; 07/02/2018 5:20 PM) Alcohol use  Remotely quit alcohol use. Caffeine use  Carbonated beverages, Coffee, Tea. No drug use  Tobacco use  Never smoker.  Family History Gabrielle Golden T. Yvonne Petite, MD; 07/02/2018 5:20 PM) Alcohol Abuse  Family Members In General. Arthritis  Family Members In General. Breast Cancer  Family Members In General. Cerebrovascular Accident  Family Members In General.  Pregnancy / Birth History Gabrielle Golden T. Maurita Havener, MD; 07/02/2018 5:20 PM) Age at menarche  14 years. Gravida  0 Irregular periods  Para  0  Vitals Santiago Glad CMA; 07/02/2018 5:02 PM) 07/02/2018 5:01 PM Weight: 410.6 lb Height: 67in Body Surface Area: 2.75 m Body Mass Index: 64.31 kg/m  Temp.: 98.6F  Pulse: 110 (Regular)  BP: 128/84 (Sitting, Left Arm, Standard)       Physical Exam Gabrielle Golden T. Naijah Lacek MD; 07/02/2018 5:21 PM) The physical exam findings are as follows: Note:General: Alert, morbidly obese young African American female, in no distress Skin: Warm and dry without rash or infection. HEENT: No palpable masses or thyromegaly. Sclera nonicteric. Pupils equal round and reactive. Oropharynx clear. Lymph nodes: No cervical, supraclavicular, or inguinal nodes palpable. Lungs: Breath sounds clear and equal. No wheezing or increased work of breathing. Cardiovascular: Regular rate and rhythm without murmer. No JVD or edema. Abdomen: Nondistended. Soft and nontender. No masses palpable. No organomegaly. No palpable hernias. Extremities: No edema or joint swelling or deformity. No chronic venous stasis changes. Neurologic: Alert and fully oriented. Gait normal. No focal weakness. Psychiatric: Normal mood and affect. Thought content appropriate with normal judgement and insight    Assessment & Plan Gabrielle Golden T. Zarie Kosiba MD; 07/02/2018 5:28 PM) MORBID OBESITY WITH BMI OF 60.0-69.9, ADULT (E66.01) Impression:  Patient with progressive morbid obesity unresponsive to multiple efforts at  medical management who presents with a BMI of 60. I believe there would be very significant medical benefit from surgical weight loss. After our discussion of surgical options currently available the patient has decided to proceed with laparoscopic sleeve gastrectomy due to the less extensive nature of the surgery compared to bypass. We have discussed the nature of the problem and the risks of remaining morbidly obese. We discussed laparoscopic sleeve gastrectomy in detail including the nature of the procedure, expected hospitalization and recovery, and major risks of anesthetic complications, bleeding, blood clots and pulmonary emboli, leakage and infection and long-term risks of stricture, , bowel obstruction, reflux, nutritional deficiencies, and failure to lose weight or weight regain. We discussed these problems could lead to death. We discussed that the procedure is not reversible. We discussed possible need for conversion to gastric bypass or other procedure. She has successfully completed her preoperative workup. We can review the consent form and all her questions were answered. She is given prescriptions for Zofran and oxycodone. Current Plans Started oxyCODONE HCl 5 MG/5ML Oral Solution, 5 Milliliter four times daily, as needed, 100 Milliliter, 07/02/2018, No Refill.  History of Present Illness Gabrielle Golden(Naszir Cott T. Suliman Termini MD; 07/02/2018 5:20 PM) The patient is a 33 year old female who presents with obesity. She returns for her preoperative visit prior to planned laparoscopic sleeve gastrectomy for surgical treatment for morbid obesity. The patient gives a history of progressive obesity since childhood despite multiple attempts at medical management. She has been through innumerable efforts at diet and exercise programs, calorie counting and supervised efforts at weight loss and has been able to lose up to 60 pounds at a time but  then experienced progressive weight regain. Her current weight of 384 pounds and BMI of 60 is near her maximum. Obesity has been affecting the patient in a number of ways including progressive difficulties with routine daily activities including climbing stairs and routine hygiene which is difficult and ongoing fatigue and shortness of breath with exertion.. She has remained remarkably free of significant comorbidities but is very concerned about her long-term health S there are multiple obesity-related illnesses on her mother's side including cancers, heart disease, diabetes, high blood pressure, stroke and others. She has successfully completed her preoperative workup. No concerns on psychological nutrition evaluation. Lab work and upper GI series were unremarkable.   Problem List/Past Medical Gabrielle Golden(Tiphany Fayson T. Ivalene Platte, MD; 07/02/2018 5:20 PM) MORBID OBESITY, UNSPECIFIED OBESITY TYPE (E66.01)   Past Surgical History Gabrielle Golden(Kylani Wires T. Arlington Sigmund, MD; 07/02/2018 5:20 PM) No pertinent past surgical history   Diagnostic Studies History Gabrielle Golden(Liliann File T. Elodia Haviland, MD; 07/02/2018 5:20 PM) Colonoscopy  never Pap Smear  1-5 years ago  Allergies Santiago Glad(Kelsey Phillips, CMA; 07/02/2018 5:02 PM) Septra *ANTI-INFECTIVE AGENTS - MISC.*  Hives. Allergies Reconciled   Medication History Santiago Glad(Kelsey Phillips, CMA; 07/02/2018 5:02 PM) No Current Medications Medications Reconciled  Social History Gabrielle Golden(Zael Shuman T. Dijon Cosens, MD; 07/02/2018 5:20 PM) Alcohol use  Remotely quit alcohol use. Caffeine use  Carbonated beverages, Coffee, Tea. No drug use  Tobacco use  Never smoker.  Family History Gabrielle Golden(Dawnette Mione T. Diontre Harps, MD; 07/02/2018 5:20 PM) Alcohol Abuse  Family Members In General. Arthritis  Family Members In General. Breast Cancer  Family Members In General. Cerebrovascular Accident  Family Members In General.  Pregnancy / Birth History Gabrielle Golden(Dorethia Jeanmarie T. Alexandre Lightsey, MD; 07/02/2018 5:20 PM) Age at menarche  14  years. Gravida  0 Irregular periods  Para  0  Vitals Santiago Glad(Kelsey Phillips CMA; 07/02/2018 5:02 PM) 07/02/2018 5:01 PM Weight: 410.6 lb Height:  67in Body Surface Area: 2.75 m Body Mass Index: 64.31 kg/m  Temp.: 98.55F  Pulse: 110 (Regular)  BP: 128/84 (Sitting, Left Arm, Standard)       Physical Exam Gabrielle Golden(Latisia Hilaire T. Mare Ludtke MD; 07/02/2018 5:21 PM) The physical exam findings are as follows: Note:General: Alert, morbidly obese young African American female, in no distress Skin: Warm and dry without rash or infection. HEENT: No palpable masses or thyromegaly. Sclera nonicteric. Pupils equal round and reactive. Oropharynx clear. Lymph nodes: No cervical, supraclavicular, or inguinal nodes palpable. Lungs: Breath sounds clear and equal. No wheezing or increased work of breathing. Cardiovascular: Regular rate and rhythm without murmer. No JVD or edema. Abdomen: Nondistended. Soft and nontender. No masses palpable. No organomegaly. No palpable hernias. Extremities: No edema or joint swelling or deformity. No chronic venous stasis changes. Neurologic: Alert and fully oriented. Gait normal. No focal weakness. Psychiatric: Normal mood and affect. Thought content appropriate with normal judgement and insight    Assessment & Plan Gabrielle Golden(Jessicamarie Amiri T. Jullisa Grigoryan MD; 07/02/2018 5:28 PM) MORBID OBESITY WITH BMI OF 60.0-69.9, ADULT (E66.01) Impression: Patient with progressive morbid obesity unresponsive to multiple efforts at medical management who presents with a BMI of 60. I believe there would be very significant medical benefit from surgical weight loss. After our discussion of surgical options currently available the patient has decided to proceed with laparoscopic sleeve gastrectomy due to the less extensive nature of the surgery compared to bypass. We have discussed the nature of the problem and the risks of remaining morbidly obese. We discussed laparoscopic sleeve gastrectomy in detail  including the nature of the procedure, expected hospitalization and recovery, and major risks of anesthetic complications, bleeding, blood clots and pulmonary emboli, leakage and infection and long-term risks of stricture, , bowel obstruction, reflux, nutritional deficiencies, and failure to lose weight or weight regain. We discussed these problems could lead to death. We discussed that the procedure is not reversible. We discussed possible need for conversion to gastric bypass or other procedure. She has successfully completed her preoperative workup. We can review the consent form and all her questions were answered. She is given prescriptions for Zofran and oxycodone. Current Plans Laparoscopic sleeve gastrectomy with upper endoscopy Started oxyCODONE HCl 5 MG/5ML Oral Solution, 5 Milliliter four times daily, as needed, 100 Milliliter, 07/02/2018, No Refill.

## 2018-07-20 MED ORDER — BUPIVACAINE LIPOSOME 1.3 % IJ SUSP
20.0000 mL | Freq: Once | INTRAMUSCULAR | Status: DC
  Filled 2018-07-20: qty 20

## 2018-07-20 NOTE — Anesthesia Preprocedure Evaluation (Signed)
Anesthesia Evaluation  Patient identified by MRN, date of birth, ID band Patient awake    Reviewed: Allergy & Precautions, NPO status , Patient's Chart, lab work & pertinent test results  Airway Mallampati: II  TM Distance: >3 FB Neck ROM: Full    Dental  (+) Dental Advisory Given   Pulmonary neg pulmonary ROS,    Pulmonary exam normal breath sounds clear to auscultation       Cardiovascular negative cardio ROS Normal cardiovascular exam Rhythm:Regular Rate:Normal     Neuro/Psych PSYCHIATRIC DISORDERS Depression negative neurological ROS     GI/Hepatic negative GI ROS, Neg liver ROS,   Endo/Other  Morbid obesity  Renal/GU negative Renal ROS     Musculoskeletal negative musculoskeletal ROS (+)   Abdominal (+) + obese,   Peds  Hematology negative hematology ROS (+)   Anesthesia Other Findings   Reproductive/Obstetrics negative OB ROS                             Anesthesia Physical Anesthesia Plan  ASA: III  Anesthesia Plan: General   Post-op Pain Management:    Induction: Intravenous  PONV Risk Score and Plan: 4 or greater and Ondansetron, Dexamethasone, Midazolam and Treatment may vary due to age or medical condition  Airway Management Planned: Oral ETT  Additional Equipment: None  Intra-op Plan:   Post-operative Plan: Extubation in OR  Informed Consent: I have reviewed the patients History and Physical, chart, labs and discussed the procedure including the risks, benefits and alternatives for the proposed anesthesia with the patient or authorized representative who has indicated his/her understanding and acceptance.     Dental advisory given  Plan Discussed with: CRNA  Anesthesia Plan Comments:         Anesthesia Quick Evaluation

## 2018-07-21 ENCOUNTER — Inpatient Hospital Stay (HOSPITAL_COMMUNITY)
Admission: RE | Admit: 2018-07-21 | Discharge: 2018-07-22 | DRG: 621 | Disposition: A | Discharge status: Home / Self Care | Payer: 59 | Attending: General Surgery | Admitting: General Surgery

## 2018-07-21 ENCOUNTER — Inpatient Hospital Stay (HOSPITAL_COMMUNITY): Payer: 59 | Admitting: Certified Registered"

## 2018-07-21 ENCOUNTER — Inpatient Hospital Stay (HOSPITAL_COMMUNITY): Payer: 59 | Admitting: Physician Assistant

## 2018-07-21 ENCOUNTER — Encounter (HOSPITAL_COMMUNITY)
Admission: RE | Disposition: A | Discharge status: Home / Self Care | Payer: Self-pay | Source: Home / Self Care | Attending: General Surgery

## 2018-07-21 ENCOUNTER — Encounter (HOSPITAL_COMMUNITY): Payer: Self-pay | Admitting: *Deleted

## 2018-07-21 ENCOUNTER — Inpatient Hospital Stay (HOSPITAL_COMMUNITY): Payer: 59

## 2018-07-21 ENCOUNTER — Other Ambulatory Visit: Payer: Self-pay

## 2018-07-21 DIAGNOSIS — Z833 Family history of diabetes mellitus: Secondary | ICD-10-CM

## 2018-07-21 DIAGNOSIS — R0902 Hypoxemia: Secondary | ICD-10-CM | POA: Diagnosis not present

## 2018-07-21 DIAGNOSIS — Z803 Family history of malignant neoplasm of breast: Secondary | ICD-10-CM

## 2018-07-21 DIAGNOSIS — Z6841 Body Mass Index (BMI) 40.0 and over, adult: Secondary | ICD-10-CM

## 2018-07-21 DIAGNOSIS — Z888 Allergy status to other drugs, medicaments and biological substances status: Secondary | ICD-10-CM | POA: Diagnosis not present

## 2018-07-21 DIAGNOSIS — Z823 Family history of stroke: Secondary | ICD-10-CM | POA: Diagnosis not present

## 2018-07-21 DIAGNOSIS — Z903 Acquired absence of stomach [part of]: Secondary | ICD-10-CM

## 2018-07-21 DIAGNOSIS — Z8249 Family history of ischemic heart disease and other diseases of the circulatory system: Secondary | ICD-10-CM

## 2018-07-21 DIAGNOSIS — R069 Unspecified abnormalities of breathing: Secondary | ICD-10-CM

## 2018-07-21 HISTORY — PX: LAPAROSCOPIC GASTRIC SLEEVE RESECTION: SHX5895

## 2018-07-21 LAB — HEMOGLOBIN AND HEMATOCRIT, BLOOD
HCT: 42.4 % (ref 36.0–46.0)
Hemoglobin: 12.8 g/dL (ref 12.0–15.0)

## 2018-07-21 LAB — TYPE AND SCREEN
ABO/RH(D): A NEG
Antibody Screen: NEGATIVE

## 2018-07-21 LAB — PREGNANCY, URINE: Preg Test, Ur: NEGATIVE

## 2018-07-21 SURGERY — GASTRECTOMY, SLEEVE, LAPAROSCOPIC
Anesthesia: General | Site: Abdomen

## 2018-07-21 MED ORDER — DEXAMETHASONE SODIUM PHOSPHATE 4 MG/ML IJ SOLN: 4.0000 mg | INTRAMUSCULAR | Status: DC

## 2018-07-21 MED ORDER — ACETAMINOPHEN 500 MG PO TABS
1000.0000 mg | ORAL_TABLET | ORAL | Status: AC
  Administered 2018-07-21: 1000 mg via ORAL
  Filled 2018-07-21: qty 2

## 2018-07-21 MED ORDER — ONDANSETRON HCL 4 MG/2ML IJ SOLN
INTRAMUSCULAR | Status: DC | PRN
  Administered 2018-07-21: 4 mg via INTRAVENOUS

## 2018-07-21 MED ORDER — MORPHINE SULFATE (PF) 2 MG/ML IV SOLN: 1.0000 mg | INTRAVENOUS | Status: DC | PRN

## 2018-07-21 MED ORDER — PROPOFOL 10 MG/ML IV BOLUS
INTRAVENOUS | Status: AC
  Filled 2018-07-21: qty 20

## 2018-07-21 MED ORDER — HYDROMORPHONE HCL 1 MG/ML IJ SOLN
INTRAMUSCULAR | Status: AC
  Filled 2018-07-21: qty 1

## 2018-07-21 MED ORDER — ORAL CARE MOUTH RINSE
15.0000 mL | Freq: Two times a day (BID) | OROMUCOSAL | Status: DC
  Administered 2018-07-21 (×2): 15 mL via OROMUCOSAL

## 2018-07-21 MED ORDER — BUPIVACAINE LIPOSOME 1.3 % IJ SUSP
INTRAMUSCULAR | Status: DC | PRN
  Administered 2018-07-21: 20 mL

## 2018-07-21 MED ORDER — PROPOFOL 10 MG/ML IV BOLUS
INTRAVENOUS | Status: DC | PRN
  Administered 2018-07-21: 300 mg via INTRAVENOUS

## 2018-07-21 MED ORDER — FENTANYL CITRATE (PF) 250 MCG/5ML IJ SOLN
INTRAMUSCULAR | Status: AC
  Filled 2018-07-21: qty 5

## 2018-07-21 MED ORDER — LIDOCAINE 2% (20 MG/ML) 5 ML SYRINGE
INTRAMUSCULAR | Status: DC | PRN
  Administered 2018-07-21: 1.5 mg/kg/h via INTRAVENOUS

## 2018-07-21 MED ORDER — LACTATED RINGERS IV SOLN
INTRAVENOUS | Status: DC
  Administered 2018-07-21: 06:00:00 via INTRAVENOUS

## 2018-07-21 MED ORDER — DEXAMETHASONE SODIUM PHOSPHATE 10 MG/ML IJ SOLN
INTRAMUSCULAR | Status: DC | PRN
  Administered 2018-07-21: 4 mg via INTRAVENOUS

## 2018-07-21 MED ORDER — PROMETHAZINE HCL 25 MG/ML IJ SOLN
6.2500 mg | INTRAMUSCULAR | Status: DC | PRN
  Administered 2018-07-21: 6.25 mg via INTRAVENOUS

## 2018-07-21 MED ORDER — PANTOPRAZOLE SODIUM 40 MG IV SOLR
40.0000 mg | Freq: Every day | INTRAVENOUS | Status: DC
  Administered 2018-07-21: 40 mg via INTRAVENOUS
  Filled 2018-07-21: qty 40

## 2018-07-21 MED ORDER — PROMETHAZINE HCL 25 MG/ML IJ SOLN
INTRAMUSCULAR | Status: AC
  Filled 2018-07-21: qty 1

## 2018-07-21 MED ORDER — KETAMINE HCL 10 MG/ML IJ SOLN
INTRAMUSCULAR | Status: AC
  Filled 2018-07-21: qty 1

## 2018-07-21 MED ORDER — DEXAMETHASONE SODIUM PHOSPHATE 10 MG/ML IJ SOLN
INTRAMUSCULAR | Status: AC
  Filled 2018-07-21: qty 1

## 2018-07-21 MED ORDER — LIDOCAINE 2% (20 MG/ML) 5 ML SYRINGE
INTRAMUSCULAR | Status: DC | PRN
  Administered 2018-07-21: 100 mg via INTRAVENOUS

## 2018-07-21 MED ORDER — FENTANYL CITRATE (PF) 250 MCG/5ML IJ SOLN
INTRAMUSCULAR | Status: DC | PRN
  Administered 2018-07-21 (×3): 50 ug via INTRAVENOUS
  Administered 2018-07-21: 100 ug via INTRAVENOUS

## 2018-07-21 MED ORDER — ACETAMINOPHEN 160 MG/5ML PO SOLN
650.0000 mg | Freq: Four times a day (QID) | ORAL | Status: DC
  Administered 2018-07-21 – 2018-07-22 (×4): 650 mg via ORAL
  Filled 2018-07-21 (×5): qty 20.3

## 2018-07-21 MED ORDER — SUGAMMADEX SODIUM 500 MG/5ML IV SOLN
INTRAVENOUS | Status: AC
  Filled 2018-07-21: qty 5

## 2018-07-21 MED ORDER — ROCURONIUM BROMIDE 10 MG/ML (PF) SYRINGE
PREFILLED_SYRINGE | INTRAVENOUS | Status: DC | PRN
  Administered 2018-07-21: 5 mg via INTRAVENOUS
  Administered 2018-07-21: 60 mg via INTRAVENOUS
  Administered 2018-07-21 (×2): 10 mg via INTRAVENOUS

## 2018-07-21 MED ORDER — POTASSIUM CHLORIDE IN NACL 20-0.9 MEQ/L-% IV SOLN
INTRAVENOUS | Status: DC
  Administered 2018-07-21 – 2018-07-22 (×3): via INTRAVENOUS
  Filled 2018-07-21 (×4): qty 1000

## 2018-07-21 MED ORDER — EVICEL 5 ML EX KIT
PACK | Freq: Once | CUTANEOUS | Status: AC
  Administered 2018-07-21: 1
  Filled 2018-07-21: qty 1

## 2018-07-21 MED ORDER — ROCURONIUM BROMIDE 100 MG/10ML IV SOLN
INTRAVENOUS | Status: AC
  Filled 2018-07-21: qty 1

## 2018-07-21 MED ORDER — ONDANSETRON HCL 4 MG/2ML IJ SOLN
4.0000 mg | INTRAMUSCULAR | Status: DC | PRN
  Administered 2018-07-21 – 2018-07-22 (×3): 4 mg via INTRAVENOUS
  Filled 2018-07-21 (×3): qty 2

## 2018-07-21 MED ORDER — OXYCODONE HCL 5 MG/5ML PO SOLN
5.0000 mg | ORAL | Status: DC | PRN
  Administered 2018-07-21 – 2018-07-22 (×4): 5 mg via ORAL
  Filled 2018-07-21 (×5): qty 5

## 2018-07-21 MED ORDER — ONDANSETRON HCL 4 MG/2ML IJ SOLN
INTRAMUSCULAR | Status: AC
  Filled 2018-07-21: qty 2

## 2018-07-21 MED ORDER — KETAMINE HCL 10 MG/ML IJ SOLN
INTRAMUSCULAR | Status: DC | PRN
  Administered 2018-07-21: 20 mg via INTRAVENOUS
  Administered 2018-07-21: 10 mg via INTRAVENOUS

## 2018-07-21 MED ORDER — HEPARIN SODIUM (PORCINE) 5000 UNIT/ML IJ SOLN
5000.0000 [IU] | INTRAMUSCULAR | Status: AC
  Administered 2018-07-21: 5000 [IU] via SUBCUTANEOUS
  Filled 2018-07-21: qty 1

## 2018-07-21 MED ORDER — ENSURE MAX PROTEIN PO LIQD
2.0000 [oz_av] | ORAL | Status: DC
  Administered 2018-07-22 (×2): 2 [oz_av] via ORAL
  Administered 2018-07-22: 09:00:00 via ORAL
  Administered 2018-07-22 (×2): 2 [oz_av] via ORAL
  Filled 2018-07-21 (×13): qty 330

## 2018-07-21 MED ORDER — SUGAMMADEX SODIUM 200 MG/2ML IV SOLN
INTRAVENOUS | Status: DC | PRN
  Administered 2018-07-21: 380 mg via INTRAVENOUS

## 2018-07-21 MED ORDER — SODIUM CHLORIDE (PF) 0.9 % IJ SOLN
INTRAMUSCULAR | Status: DC | PRN
  Administered 2018-07-21: 50 mL

## 2018-07-21 MED ORDER — GABAPENTIN 300 MG PO CAPS
300.0000 mg | ORAL_CAPSULE | ORAL | Status: AC
  Administered 2018-07-21: 300 mg via ORAL
  Filled 2018-07-21: qty 1

## 2018-07-21 MED ORDER — LACTATED RINGERS IR SOLN
Status: DC | PRN
  Administered 2018-07-21: 1000 mL

## 2018-07-21 MED ORDER — SCOPOLAMINE 1 MG/3DAYS TD PT72
1.0000 | MEDICATED_PATCH | TRANSDERMAL | Status: DC
  Administered 2018-07-21: 1.5 mg via TRANSDERMAL
  Filled 2018-07-21: qty 1

## 2018-07-21 MED ORDER — 0.9 % SODIUM CHLORIDE (POUR BTL) OPTIME
TOPICAL | Status: DC | PRN
  Administered 2018-07-21: 1000 mL

## 2018-07-21 MED ORDER — MEPERIDINE HCL 50 MG/ML IJ SOLN: 6.2500 mg | INTRAMUSCULAR | Status: DC | PRN

## 2018-07-21 MED ORDER — STERILE WATER FOR IRRIGATION IR SOLN
Status: DC | PRN
  Administered 2018-07-21: 1000 mL

## 2018-07-21 MED ORDER — SODIUM CHLORIDE (PF) 0.9 % IJ SOLN
INTRAMUSCULAR | Status: AC
  Filled 2018-07-21: qty 50

## 2018-07-21 MED ORDER — CHLORHEXIDINE GLUCONATE CLOTH 2 % EX PADS: 6.0000 | MEDICATED_PAD | Freq: Once | CUTANEOUS | Status: DC

## 2018-07-21 MED ORDER — ENOXAPARIN SODIUM 30 MG/0.3ML ~~LOC~~ SOLN
30.0000 mg | Freq: Two times a day (BID) | SUBCUTANEOUS | Status: DC
  Administered 2018-07-21 – 2018-07-22 (×2): 30 mg via SUBCUTANEOUS
  Filled 2018-07-21 (×2): qty 0.3

## 2018-07-21 MED ORDER — BUPIVACAINE HCL (PF) 0.25 % IJ SOLN
INTRAMUSCULAR | Status: DC | PRN
  Administered 2018-07-21: 30 mL

## 2018-07-21 MED ORDER — SODIUM CHLORIDE 0.9 % IV SOLN
2.0000 g | INTRAVENOUS | Status: AC
  Administered 2018-07-21: 2 g via INTRAVENOUS
  Filled 2018-07-21: qty 2

## 2018-07-21 MED ORDER — APREPITANT 40 MG PO CAPS
40.0000 mg | ORAL_CAPSULE | ORAL | Status: AC
  Administered 2018-07-21: 40 mg via ORAL
  Filled 2018-07-21: qty 1

## 2018-07-21 MED ORDER — BUPIVACAINE HCL (PF) 0.25 % IJ SOLN
INTRAMUSCULAR | Status: AC
  Filled 2018-07-21: qty 30

## 2018-07-21 MED ORDER — MIDAZOLAM HCL 2 MG/2ML IJ SOLN
INTRAMUSCULAR | Status: DC | PRN
  Administered 2018-07-21: 2 mg via INTRAVENOUS

## 2018-07-21 MED ORDER — LIDOCAINE 2% (20 MG/ML) 5 ML SYRINGE
INTRAMUSCULAR | Status: AC
  Filled 2018-07-21: qty 5

## 2018-07-21 MED ORDER — HYDROMORPHONE HCL 1 MG/ML IJ SOLN
0.2500 mg | INTRAMUSCULAR | Status: DC | PRN
  Administered 2018-07-21 (×2): 0.5 mg via INTRAVENOUS

## 2018-07-21 MED ORDER — LIDOCAINE HCL 2 % IJ SOLN
INTRAMUSCULAR | Status: AC
  Filled 2018-07-21: qty 20

## 2018-07-21 MED ORDER — MIDAZOLAM HCL 2 MG/2ML IJ SOLN
INTRAMUSCULAR | Status: AC
  Filled 2018-07-21: qty 2

## 2018-07-21 SURGICAL SUPPLY — 59 items
APPLICATOR COTTON TIP 6 STRL (MISCELLANEOUS) IMPLANT
APPLICATOR COTTON TIP 6IN STRL (MISCELLANEOUS)
APPLIER CLIP ROT 10 11.4 M/L (STAPLE)
APPLIER CLIP ROT 13.4 12 LRG (CLIP)
BLADE SURG SZ11 CARB STEEL (BLADE) ×3 IMPLANT
CABLE HIGH FREQUENCY MONO STRZ (ELECTRODE) ×3 IMPLANT
CHLORAPREP W/TINT 26ML (MISCELLANEOUS) ×3 IMPLANT
CLIP APPLIE ROT 10 11.4 M/L (STAPLE) IMPLANT
CLIP APPLIE ROT 13.4 12 LRG (CLIP) IMPLANT
COVER WAND RF STERILE (DRAPES) ×3 IMPLANT
DERMABOND ADVANCED (GAUZE/BANDAGES/DRESSINGS) ×2
DERMABOND ADVANCED .7 DNX12 (GAUZE/BANDAGES/DRESSINGS) ×1 IMPLANT
DEVICE SUT QUICK LOAD TK 5 (STAPLE) IMPLANT
DEVICE SUT TI-KNOT TK 5X26 (MISCELLANEOUS) IMPLANT
DEVICE SUTURE ENDOST 10MM (ENDOMECHANICALS) IMPLANT
DEVICE TI KNOT TK5 (MISCELLANEOUS)
DRAPE UTILITY XL STRL (DRAPES) ×6 IMPLANT
ELECT REM PT RETURN 15FT ADLT (MISCELLANEOUS) ×3 IMPLANT
GAUZE SPONGE 4X4 12PLY STRL (GAUZE/BANDAGES/DRESSINGS) IMPLANT
GLOVE BIOGEL PI IND STRL 7.5 (GLOVE) ×1 IMPLANT
GLOVE BIOGEL PI INDICATOR 7.5 (GLOVE) ×2
GLOVE ECLIPSE 7.5 STRL STRAW (GLOVE) ×3 IMPLANT
GOWN STRL REUS W/TWL XL LVL3 (GOWN DISPOSABLE) ×12 IMPLANT
GRASPER SUT TROCAR 14GX15 (MISCELLANEOUS) IMPLANT
HOVERMATT SINGLE USE (MISCELLANEOUS) ×3 IMPLANT
KIT BASIN OR (CUSTOM PROCEDURE TRAY) ×3 IMPLANT
MARKER SKIN DUAL TIP RULER LAB (MISCELLANEOUS) ×3 IMPLANT
NEEDLE SPNL 22GX3.5 QUINCKE BK (NEEDLE) ×3 IMPLANT
PACK UNIVERSAL I (CUSTOM PROCEDURE TRAY) ×3 IMPLANT
QUICK LOAD TK 5 (STAPLE)
RELOAD STAPLER BLUE 60MM (STAPLE) ×4 IMPLANT
RELOAD STAPLER GOLD 60MM (STAPLE) ×1 IMPLANT
RELOAD STAPLER GREEN 60MM (STAPLE) ×1 IMPLANT
SCISSORS LAP 5X45 EPIX DISP (ENDOMECHANICALS) ×3 IMPLANT
SET IRRIG TUBING LAPAROSCOPIC (IRRIGATION / IRRIGATOR) ×3 IMPLANT
SET TUBE SMOKE EVAC HIGH FLOW (TUBING) ×3 IMPLANT
SHEARS HARMONIC ACE PLUS 45CM (MISCELLANEOUS) ×3 IMPLANT
SLEEVE ADV FIXATION 5X100MM (TROCAR) ×6 IMPLANT
SLEEVE GASTRECTOMY 36FR VISIGI (MISCELLANEOUS) ×3 IMPLANT
SOLUTION ANTI FOG 6CC (MISCELLANEOUS) ×3 IMPLANT
SPONGE LAP 18X18 RF (DISPOSABLE) ×3 IMPLANT
STAPLER ECHELON LONG 60 440 (INSTRUMENTS) ×3 IMPLANT
STAPLER RELOAD BLUE 60MM (STAPLE) ×12
STAPLER RELOAD GOLD 60MM (STAPLE) ×3
STAPLER RELOAD GREEN 60MM (STAPLE) ×3
SUT MNCRL AB 4-0 PS2 18 (SUTURE) ×3 IMPLANT
SUT SURGIDAC NAB ES-9 0 48 120 (SUTURE) IMPLANT
SUT VIC AB 0 BRD 54 (SUTURE) IMPLANT
SYR 10ML ECCENTRIC (SYRINGE) ×3 IMPLANT
SYR 20CC LL (SYRINGE) ×6 IMPLANT
SYSTEM WECK SHIELD CLOSURE (TROCAR) ×3 IMPLANT
TIP RIGID 35CM EVICEL (HEMOSTASIS) ×3 IMPLANT
TOWEL OR 17X26 10 PK STRL BLUE (TOWEL DISPOSABLE) ×3 IMPLANT
TOWEL OR NON WOVEN STRL DISP B (DISPOSABLE) ×3 IMPLANT
TROCAR ADV FIXATION 5X100MM (TROCAR) ×3 IMPLANT
TROCAR BLADELESS 15MM (ENDOMECHANICALS) ×3 IMPLANT
TROCAR BLADELESS OPT 5 100 (ENDOMECHANICALS) ×3 IMPLANT
TUBING CONNECTING 10 (TUBING) ×2 IMPLANT
TUBING CONNECTING 10' (TUBING) ×1

## 2018-07-21 NOTE — Anesthesia Postprocedure Evaluation (Signed)
Anesthesia Post Note  Patient: Gabrielle Golden  Procedure(s) Performed: LAPAROSCOPIC GASTRIC SLEEVE RESECTION WITH UPPER ENDO AND ERAS PATHWAY (N/A Abdomen)     Patient location during evaluation: PACU Anesthesia Type: General Level of consciousness: sedated and patient cooperative Pain management: pain level controlled Vital Signs Assessment: post-procedure vital signs reviewed and stable Respiratory status: spontaneous breathing Cardiovascular status: stable Anesthetic complications: no Comments: Some worry of negative pressure pulmonary edema due to obstruction post extubation. Discussed with Dr. Johna Sheriff and will do overnight in step down for observationl    Last Vitals:  Vitals:   07/21/18 1700 07/21/18 1904  BP: (!) 137/98   Pulse: 84   Resp: 19   Temp:  36.7 C  SpO2: 91%     Last Pain:  Vitals:   07/21/18 1904  TempSrc: Oral  PainSc:                  Gabrielle Golden

## 2018-07-21 NOTE — Op Note (Signed)
Preoperative diagnosis: laparoscopic sleeve gastrectomy  Postoperative diagnosis: Same   Procedure: Upper endoscopy   Surgeon: Berna Bue, M.D.  Anesthesia: Gen.   Description of procedure: The endoscopy was placed in the mouth and into the oropharynx and under endoscopic vision it was advanced to the esophagogastric junction.  The pouch was insufflated and no bleeding or bubbles were seen.  The GEJ was identified at 38cm from the teeth. The gastric lumen is a smooth even tube, without undue narrowing, twisting or angulation specifically at the incisura. No bleeding or leaks were detected. The scope was withdrawn without difficulty.    Berna Bue, M.D. General, Bariatric, & Minimally Invasive Surgery [facility]

## 2018-07-21 NOTE — Anesthesia Procedure Notes (Signed)
Procedure Name: Intubation Date/Time: 07/21/2018 7:36 AM Performed by: Nelle Don, CRNA Pre-anesthesia Checklist: Patient being monitored, Suction available, Emergency Drugs available and Patient identified Patient Re-evaluated:Patient Re-evaluated prior to induction Oxygen Delivery Method: Circle system utilized Preoxygenation: Pre-oxygenation with 100% oxygen Induction Type: IV induction Ventilation: Mask ventilation without difficulty Laryngoscope Size: Glidescope and 4 Grade View: Grade I Tube type: Oral Tube size: 7.0 mm Number of attempts: 1 Airway Equipment and Method: Video-laryngoscopy and Stylet Placement Confirmation: ETT inserted through vocal cords under direct vision,  positive ETCO2 and breath sounds checked- equal and bilateral Secured at: 22 cm Tube secured with: Tape Dental Injury: Teeth and Oropharynx as per pre-operative assessment  Comments: Elective glidescope used. Grade 1 view.

## 2018-07-21 NOTE — Progress Notes (Signed)
Dr Malen Gauze at bedside. Aware of RR, o2 sats. Pt repositioned, encouraged to cough and DB. He will speak with Dr Renold Don. Will monitor

## 2018-07-21 NOTE — Op Note (Signed)
Preoperative Diagnosis: MORBID OBESITY  Postoprative Diagnosis: MORBID OBESITY  Procedure: Procedure(s): LAPAROSCOPIC GASTRIC SLEEVE RESECTION WITH UPPER ENDO AND ERAS PATHWAY   Surgeon: Glenna Fellows T   Assistants: Phylliss Blakes  Anesthesia:  General endotracheal anesthesia  Indications: Patient with progressive morbid obesity unresponsive to multiple efforts at medical management who presents with a BMI of 60.  After extensive preoperative work-up and discussion detailed elsewhere we have elected to proceed with laparoscopic sleeve gastrectomy for treatment of her morbid obesity.    Procedure Detail: Patient was brought to the operating room, placed in supine position on the operating table, and general endotracheal anesthesia induced.  She received preoperative IV antibiotics and subcutaneous heparin.  PAS were in place.  The abdomen was widely sterilely prepped and draped.  Patient timeout was performed and correct procedure verified.  Access was obtained with a 5 mm Optiview trocar in the left upper quadrant without difficulty and pneumoperitoneum established.  Under direct vision a 5 mm trocar was placed laterally in the right upper quadrant, a 15 mm trocar at the base of the falciform ligament, a 5 mm trocar just to the left of the umbilicus for the camera port and an additional 5 mm trocar laterally in the left upper quadrant.  Patient was placed in steep reverse Trendelenburg.  Through a 5 mm subxiphoid site the [facility] retractor was placed in the left lobe of the liver elevated with excellent exposure of the entire stomach and hiatus.  There was no evidence of hiatal hernia on exam and preoperative upper GI series was normal.  The dissection was begun at the mid greater curve dissecting greater curve vasculature with the harmonic scalpel and entering the lesser sac.  The dissection progressed proximally along the greater curve dividing short gastrics with the harmonic scalpel.   The fundus was completely mobilized away from the spleen.  The left crus was fully dissected and the esophageal fat pad mobilized.  After complete dissection of the proximal stomach we continued distally along the greater curve mobilizing vasculature with harmonic scalpel until the stomach was completely freed to 5 cm from the pylorus.  A few avascular posterior attachments were divided until the stomach was completely freed along its lesser curve vasculature.  The 59 French VISI G tube was passed orally and advanced with this tip to the pylorus.  It was positioned along the lesser curve and placed on continuous suction.  The sleeve was begun with an initial firing of the 60 mm green load stapler about 5 cm from the pylorus angling down away from the incisura.  Following this a firing of the 60 mm gold load stapler was continued proximally again allowing extra room around the incisura.  The sleeve was then completed with for further firings of the 60 mm blue load stapler gradually tapering in toward the VISI G-tube with the final staple line going just lateral to the esophageal fat pad at right angles to the gastric wall.  The sleeve was insufflated with air and under saline irrigation there was no leak.  It was symmetrical with nice tapering and no kinking and plenty of room at the incisura.  The sleeve was desufflated and the VISI G-tube removed.  Dr. Doylene Canard performed upper endoscopy showing no leak or bleeding or stricture.  The gastric specimen was then brought out through the 15 mm trocar site after dilating this slightly.  The site was then closed with 0 interrupted Vicryl using the Weck closure device.  The patient had  undergone a bilateral T AP block with dilute Exparel L.  The [facility] retractor was removed under direct vision and all CO2 evacuated.  Skin incisions were closed with subcuticular Monocryl and Dermabond.  Sponge needle and instrument counts were correct.    Findings: As  above  Estimated Blood Loss:  Minimal         Drains: None  Blood Given: none          Specimens: Greater curvature of stomach        Complications:  * No complications entered in OR log *         Disposition: PACU - hemodynamically stable.         Condition: stable

## 2018-07-21 NOTE — Discharge Instructions (Signed)
° ° ° °GASTRIC BYPASS/SLEEVE ° Home Care Instructions ° ° These instructions are to help you care for yourself when you go home. ° °Call: If you have any problems. °• Call [phone] and ask for the surgeon on call °• If you need immediate help, come to the ER at [facility].  °• Tell the ER staff that you are a new post-op gastric bypass or gastric sleeve patient °  °Signs and symptoms to report: • Severe vomiting or nausea °o If you cannot keep down clear liquids for longer than 1 day, call your surgeon  °• Abdominal pain that does not get better after taking your pain medication °• Fever over 100.4° F with chills °• Heart beating over 100 beats a minute °• Shortness of breath at rest °• Chest pain °•  Redness, swelling, drainage, or foul odor at incision (surgical) sites °•  If your incisions open or pull apart °• Swelling or pain in calf (lower leg) °• Diarrhea (Loose bowel movements that happen often), frequent watery, uncontrolled bowel movements °• Constipation, (no bowel movements for 3 days) if this happens: Pick one °o Milk of Magnesia, 2 tablespoons by mouth, 3 times a day for 2 days if needed °o Stop taking Milk of Magnesia once you have a bowel movement °o Call your doctor if constipation continues °Or °o Miralax  (instead of Milk of Magnesia) following the label instructions °o Stop taking Miralax once you have a bowel movement °o Call your doctor if constipation continues °• Anything you think is not normal °  °Normal side effects after surgery: • Unable to sleep at night or unable to focus °• Irritability or moody °• Being tearful (crying) or depressed °These are common complaints, possibly related to your anesthesia medications that put you to sleep, stress of surgery, and change in lifestyle.  This usually goes away a few weeks after surgery.  If these feelings continue, call your primary care doctor. °  °Wound Care: You may have surgical glue, steri-strips, or staples over your incisions after  surgery °• Surgical glue:  Looks like a clear film over your incisions and will wear off a little at a time °• Steri-strips: Strips of tape over your incisions. You may notice a yellowish color on the skin under the steri-strips. This is used to make the   steri-strips stick better. Do not pull the steri-strips off - let them fall off °• Staples: Staples may be removed before you leave the hospital °o If you go home with staples, call [facility], [phone] at for an appointment with your surgeon’s nurse to have staples removed 10 days after surgery. °• Showering: You may shower two (2) days after your surgery unless your surgeon tells you differently °o Wash gently around incisions with warm soapy water, rinse well, and gently pat dry  °o No tub baths until staples are removed, steri-strips fall off or glue is gone.  °  °Medications: • Medications should be liquid or crushed if larger than the size of a dime °• Extended release pills (medication that release a little bit at a time through the day) should NOT be crushed or cut. (examples include XL, ER, DR, SR) °• Depending on the size and number of medications you take, you may need to space (take a few throughout the day)/change the time you take your medications so that you do not over-fill your pouch (smaller stomach) °• Make sure you follow-up with your primary care doctor to   make medication changes needed during rapid weight loss and life-style changes °• If you have diabetes, follow up with the doctor that orders your diabetes medication(s) within one week after surgery and check your blood sugar regularly. °• Do not drive while taking prescription pain medication  °• It is ok to take Tylenol by the bottle instructions with your pain medicine or instead of your pain medicine as needed.  DO NOT TAKE NSAIDS (EXAMPLES OF NSAIDS:  IBUPROFREN/ NAPROXEN)  °Diet:                    First 2 Weeks ° You will see the dietician t about two (2) weeks  after your surgery. The dietician will increase the types of foods you can eat if you are handling liquids well: °• If you have severe vomiting or nausea and cannot keep down clear liquids lasting longer than 1 day, call your surgeon @ ([phone]) °Protein Shake °• Drink at least 2 ounces of shake 5-6 times per day °• Each serving of protein shakes (usually 8 - 12 ounces) should have: °o 15 grams of protein  °o And no more than 5 grams of carbohydrate  °• Goal for protein each day: °o Men = 80 grams per day °o Women = 60 grams per day °• Protein powder may be added to fluids such as non-fat milk or Lactaid milk or unsweetened Soy/Almond milk (limit to 35 grams added protein powder per serving) ° °Hydration °• Slowly increase the amount of water and other clear liquids as tolerated (See Acceptable Fluids) °• Slowly increase the amount of protein shake as tolerated  °•  Sip fluids slowly and throughout the day.  Do not use straws. °• May use sugar substitutes in small amounts (no more than 6 - 8 packets per day; i.e. Splenda) ° °Fluid Goal °• The first goal is to drink at least 8 ounces of protein shake/drink per day (or as directed by the nutritionist); some examples of protein shakes are Syntrax Nectar, Adkins Advantage, EAS Edge HP, and Unjury. See handout from pre-op Bariatric Education Class: °o Slowly increase the amount of protein shake you drink as tolerated °o You may find it easier to slowly sip shakes throughout the day °o It is important to get your proteins in first °• Your fluid goal is to drink 64 - 100 ounces of fluid daily °o It may take a few weeks to build up to this °• 32 oz (or more) should be clear liquids  °And  °• 32 oz (or more) should be full liquids (see below for examples) °• Liquids should not contain sugar, caffeine, or carbonation ° °Clear Liquids: °• Water or Sugar-free flavored water (i.e. Fruit H2O, Propel) °• Decaffeinated coffee or tea (sugar-free) °• Crystal Lite, Wyler’s Lite,  Minute Maid Lite °• Sugar-free Jell-O °• Bouillon or broth °• Sugar-free Popsicle:   *Less than 20 calories each; Limit 1 per day ° °Full Liquids: °Protein Shakes/Drinks + 2 choices per day of other full liquids °• Full liquids must be: °o No More Than 15 grams of Carbs per serving  °o No More Than 3 grams of Fat per serving °• Strained low-fat cream soup (except Cream of Potato or Tomato) °• Non-Fat milk °• Fat-free Lactaid Milk °• Unsweetened Soy Or Unsweetened Almond Milk °• Low Sugar yogurt (Dannon Lite & Fit, Greek yogurt; Oikos Triple Zero; Chobani Simply 100; Yoplait 100 calorie Greek - No Fruit on the Bottom) ° °  °Vitamins   and Minerals • Start 1 day after surgery unless otherwise directed by your surgeon °• 2 Chewable Bariatric Specific Multivitamin / Multimineral Supplement with iron (Example: Bariatric Advantage Multi EA) °• Chewable Calcium with Vitamin D-3 °(Example: 3 Chewable Calcium Plus 600 with Vitamin D-3) °o Take 500 mg three (3) times a day for a total of 1500 mg each day °o Do not take all 3 doses of calcium at one time as it may cause constipation, and you can only absorb 500 mg  at a time  °o Do not mix multivitamins containing iron with calcium supplements; take 2 hours apart °• Menstruating women and those with a history of anemia (a blood disease that causes weakness) may need extra iron °o Talk with your doctor to see if you need more iron °• Do not stop taking or change any vitamins or minerals until you talk to your dietitian or surgeon °• Your Dietitian and/or surgeon must approve all vitamin and mineral supplements °  °Activity and Exercise: Limit your physical activity as instructed by your doctor.  It is important to continue walking at home.  During this time, use these guidelines: °• Do not lift anything greater than ten (10) pounds for at least two (2) weeks °• Do not go back to work or drive until your surgeon says you can °• You may have sex when you feel comfortable  °o It is  VERY important for female patients to use a reliable birth control method; fertility often increases after surgery  °o All hormonal birth control will be ineffective for 30 days after surgery due to medications given during surgery a barrier method must be used. °o Do not get pregnant for at least 18 months °• Start exercising as soon as your doctor tells you that you can °o Make sure your doctor approves any physical activity °• Start with a simple walking program °• Walk 5-15 minutes each day, 7 days per week.  °• Slowly increase until you are walking 30-45 minutes per day °Consider joining our BELT program. [phone] or email belt@[employer].edu °  °Special Instructions Things to remember: °• Use your CPAP when sleeping if this applies to you ° °• [facility] has two free Bariatric Surgery Support Groups that meet monthly °o The 3rd Thursday of each month, 6 pm, [facility]  °o The 2nd Friday of each month, 11:45 am in the private dining room in the basement of [facility] °• It is very important to keep all follow up appointments with your surgeon, dietitian, primary care physician, and behavioral health practitioner °• Routine follow up schedule with your surgeon include appointments at 2-3 weeks, 6-8 weeks, 6 months, and 1 year at a minimum.  Your surgeon may request to see you more often.   °o After the first year, please follow up with your bariatric surgeon and dietitian at least once a year in order to maintain best weight loss results °[facility]: [phone] °[facility]: [phone] °Bariatric Nurse Coordinator: [phone] °  °   Reviewed and Endorsed  °by [facility] Patient Education Committee, June, 2016 °Edits Approved: Aug, 2018 ° ° ° °

## 2018-07-21 NOTE — Progress Notes (Signed)
Xray report called to Dr Renold Don. He has recommended tx to stepdown. WIll speak with Dr Johna Sheriff.

## 2018-07-21 NOTE — Progress Notes (Signed)
Discussed post op day goals with patient and bedside RN including ambulation, IS, diet progression, pain, and nausea control.  Questions answered.

## 2018-07-21 NOTE — Interval H&P Note (Signed)
History and Physical Interval Note:  07/21/2018 7:07 AM  Gabrielle Golden  has presented today for surgery, with the diagnosis of MORBID OBESITY  The various methods of treatment have been discussed with the patient and family. After consideration of risks, benefits and other options for treatment, the patient has consented to  Procedure(s): LAPAROSCOPIC GASTRIC SLEEVE RESECTION WITH UPPER ENDO AND ERAS PATHWAY (N/A) as a surgical intervention .  The patient's history has been reviewed, patient examined, no change in status, stable for surgery.  I have reviewed the patient's chart and labs.  Questions were answered to the patient's satisfaction.     Lorne Skeens Lavada Langsam

## 2018-07-21 NOTE — Progress Notes (Signed)
PHARMACY CONSULT FOR:  Risk Assessment for Post-Discharge VTE Following Bariatric Surgery  Post-Discharge VTE Risk Assessment: This patient's probability of 30-day post-discharge VTE is increased due to the factors marked:   Female    Age >/=60 years  X  BMI >/=50 kg/m2    CHF    Dyspnea at Rest    Paraplegia  X  Non-gastric-band surgery    Operation Time >/=3 hr    Return to OR     Length of Stay >/= 3 d   Predicted probability of 30-day post-discharge VTE:  0.27% Other patient-specific factors to consider:   Recommendation for Discharge: No pharmacologic prophylaxis post-discharge   Gabrielle Golden is a 33 y.o. female who underwent laparoscopic gastric sleeve resection w/ upper endo and eras pathway on 07/21/18.   Case start: 07:52 Case end: 09:19    Allergies  Allergen Reactions  . Septra [Sulfamethoxazole-Trimethoprim] Hives    Patient Measurements: Height: 5\' 7"  (170.2 cm) Weight: (!) 407 lb (184.6 kg) IBW/kg (Calculated) : 61.6 Body mass index is 63.75 kg/m.  Recent Labs    07/21/18 1150  HGB 12.8  HCT 42.4   Estimated Creatinine Clearance: 153.1 mL/min (by C-G formula based on SCr of 0.94 mg/dL).    Past Medical History:  Diagnosis Date  . Depression   . Morbid obesity (HCC)      No medications prior to admission.     Lynann Beaver PharmD, BCPS Pager [phone] 07/21/2018 1:50 PM

## 2018-07-21 NOTE — Progress Notes (Signed)
Dr Renold Don at bedside. Stat PCXR ordered. WIll monitor.

## 2018-07-21 NOTE — Transfer of Care (Signed)
Immediate Anesthesia Transfer of Care Note  Patient: Gabrielle Golden  Procedure(s) Performed: LAPAROSCOPIC GASTRIC SLEEVE RESECTION WITH UPPER ENDO AND ERAS PATHWAY (N/A Abdomen)  Patient Location: PACU  Anesthesia Type:General  Level of Consciousness: awake, alert  and oriented  Airway & Oxygen Therapy: Patient Spontanous Breathing and Patient connected to face mask oxygen  Post-op Assessment: Report given to RN and Post -op Vital signs reviewed and stable  Post vital signs: Reviewed and stable  Last Vitals:  Vitals Value Taken Time  BP    Temp    Pulse 90 07/21/2018  9:37 AM  Resp 25 07/21/2018  9:37 AM  SpO2 100 % 07/21/2018  9:37 AM  Vitals shown include unvalidated device data.  Last Pain:  Vitals:   07/21/18 0602  TempSrc:   PainSc: 0-No pain      Patients Stated Pain Goal: 3 (07/21/18 0602)  Complications: No apparent anesthesia complications

## 2018-07-22 ENCOUNTER — Encounter (HOSPITAL_COMMUNITY): Payer: Self-pay | Admitting: General Surgery

## 2018-07-22 LAB — CBC WITH DIFFERENTIAL/PLATELET
Abs Immature Granulocytes: 0.04 10*3/uL (ref 0.00–0.07)
BASOS PCT: 0 %
Basophils Absolute: 0 10*3/uL (ref 0.0–0.1)
Eosinophils Absolute: 0 10*3/uL (ref 0.0–0.5)
Eosinophils Relative: 0 %
HEMATOCRIT: 37.5 % (ref 36.0–46.0)
Hemoglobin: 11.6 g/dL — ABNORMAL LOW (ref 12.0–15.0)
Immature Granulocytes: 0 %
Lymphocytes Relative: 15 %
Lymphs Abs: 2 10*3/uL (ref 0.7–4.0)
MCH: 26.1 pg (ref 26.0–34.0)
MCHC: 30.9 g/dL (ref 30.0–36.0)
MCV: 84.3 fL (ref 80.0–100.0)
Monocytes Absolute: 1 10*3/uL (ref 0.1–1.0)
Monocytes Relative: 7 %
Neutro Abs: 10.5 10*3/uL — ABNORMAL HIGH (ref 1.7–7.7)
Neutrophils Relative %: 78 %
Platelets: 351 10*3/uL (ref 150–400)
RBC: 4.45 MIL/uL (ref 3.87–5.11)
RDW: 16.3 % — ABNORMAL HIGH (ref 11.5–15.5)
WBC: 13.6 10*3/uL — ABNORMAL HIGH (ref 4.0–10.5)
nRBC: 0 % (ref 0.0–0.2)

## 2018-07-22 MED ORDER — PANTOPRAZOLE SODIUM 40 MG IV SOLR: 40.0000 mg | Freq: Every day | INTRAVENOUS | 1 refills | Status: DC

## 2018-07-22 MED ORDER — OXYCODONE HCL 5 MG/5ML PO SOLN: 5.0000 mg | ORAL | 0 refills | Status: AC | PRN

## 2018-07-22 NOTE — Progress Notes (Signed)
Discharge Summary Patient alert and oriented x4. Patient read and understood discharge instructions. RN informed patient to come back to hospital or call if there are any problems. Mom and friend at bedside. Patient walked out of hospital with all belongings.

## 2018-07-22 NOTE — Plan of Care (Signed)
  Problem: Education: Goal: Knowledge of General Education information will improve Description Including pain rating scale, medication(s)/side effects and non-pharmacologic comfort measures Outcome: Progressing   Problem: Health Behavior/Discharge Planning: Goal: Ability to manage health-related needs will improve Outcome: Progressing   Problem: Clinical Measurements: Goal: Ability to maintain clinical measurements within normal limits will improve Outcome: Progressing   Problem: Clinical Measurements: Goal: Will remain free from infection Outcome: Progressing   Problem: Clinical Measurements: Goal: Respiratory complications will improve Outcome: Progressing   Problem: Activity: Goal: Risk for activity intolerance will decrease Outcome: Progressing   Problem: Nutrition: Goal: Adequate nutrition will be maintained Outcome: Progressing   Problem: Pain Managment: Goal: General experience of comfort will improve Outcome: Progressing   Problem: Nutrition Goal: Patient/Family demonstrates understanding of diet Outcome: Progressing   Problem: Nutrition Goal: Patient will verbalize dietary restrictions Outcome: Progressing

## 2018-07-22 NOTE — Progress Notes (Signed)
Patient alert and oriented, pain is controlled. Patient is tolerating fluids, advanced to protein shake today, patient is tolerating well.  Reviewed Gastric sleeve discharge instructions with patient and patient is able to articulate understanding.  Provided information on BELT program, Support Group and [facility] outpatient pharmacy. All questions answered, will continue to monitor.  Total fluid intake 630 Per dehydration protocol call back one week post op

## 2018-07-22 NOTE — Progress Notes (Signed)
Patient ID: Gabrielle Golden, female   DOB: 1985-07-17, 33 y.o.   MRN: [id number] 1 Day Post-Op   Subjective: No major complaints.  Moderate epigastric pain.  She gets increased epigastric pain with cold liquids.  Denies shortness of breath.  Objective: Vital signs in last 24 hours: Temp:  [97.6 F (36.4 C)-98.8 F (37.1 C)] 97.6 F (36.4 C) (02/11 0750) Pulse Rate:  [66-94] 72 (02/11 0800) Resp:  [11-38] 26 (02/11 0800) BP: (121-148)/(67-98) 140/83 (02/11 0800) SpO2:  [88 %-100 %] 89 % (02/11 0800) Weight:  [185.2 kg] 185.2 kg (02/10 1340)    Intake/Output from previous day: 02/10 0701 - 02/11 0700 In: 2202.7 [P.O.:360; I.V.:1742.7; IV Piggyback:100] Out: 2620 [Urine:2600; Blood:20] Intake/Output this shift: Total I/O In: 939.9 [I.V.:939.9] Out: -   General appearance: alert, cooperative and no distress Resp: clear to auscultation bilaterally and With no increased work of breathing GI: Mild appropriate epigastric tenderness Incision/Wound: No erythema or drainage  Lab Results:  Recent Labs    07/21/18 1150 07/22/18 0328  WBC  --  13.6*  HGB 12.8 11.6*  HCT 42.4 37.5  PLT  --  351   BMET No results for input(s): NA, K, CL, CO2, GLUCOSE, BUN, CREATININE, CALCIUM in the last 72 hours.   Studies/Results: Dg Chest Port 1 View  Result Date: 07/21/2018 CLINICAL DATA:  Abnormal respiration after surgery EXAM: PORTABLE CHEST 1 VIEW COMPARISON:  10/16/2017 FINDINGS: Low lung volumes with interstitial crowding. Stable, normal heart size given differences in technique. No evident effusion or pneumothorax. IMPRESSION: Low volume chest with perihilar indistinctness favoring atelectasis. Electronically Signed   By: Marnee SpringJonathon  Watts M.D.   On: 07/21/2018 11:30    Anti-infectives: Anti-infectives (From admission, onward)   Start     Dose/Rate Route Frequency Ordered Stop   07/21/18 0600  cefoTEtan (CEFOTAN) 2 g in sodium chloride 0.9 % 100 mL IVPB     2 g 200 mL/hr over 30  Minutes Intravenous On call to O.R. 07/21/18 0546 07/21/18 0753      Assessment/Plan: s/p Procedure(s): LAPAROSCOPIC GASTRIC SLEEVE RESECTION WITH UPPER ENDO AND ERAS PATHWAY Initial hypoxia in recovery room yesterday.  Chest x-ray shows mild perihilar haziness of uncertain etiology.  O2 sats normal when she takes a deep breath, currently 95 on my exam.  Lungs clear.  Some epigastric pain not unexpected.  Not really taking any protein shakes yet. Observe through this morning in terms of O2 sats and oral intake.  Possibly home later today or tomorrow.  Discussed with patient and family and all questions answered.   LOS: 1 day    Mariella SaaBenjamin T Mahogony Gilchrest 07/22/2018

## 2018-07-22 NOTE — Progress Notes (Signed)
Patient alert and oriented, Post op day 1.  Provided support and encouragement.  Encouraged pulmonary toilet, ambulation and small sips of liquids. Started patients protein.  All questions answered.  Will continue to monitor.

## 2018-07-22 NOTE — Plan of Care (Signed)

## 2018-07-28 ENCOUNTER — Telehealth (HOSPITAL_COMMUNITY): Payer: Self-pay

## 2018-07-28 NOTE — Telephone Encounter (Signed)
Patient called to discuss post bariatric surgery follow up questions.  See below:   1.  Tell me about your pain and pain management?tylenol once after discharge no pain medication at all  2.  Let's talk about fluid intake.  How much total fluid are you taking in? Getting 55-64 ounces  3.  How much protein have you taken in the last 2 days? 60 grams  4.  Have you had nausea?  Tell me about when have experienced nausea and what you did to help?denies nausea  5.  Has the frequency or color changed with your urine?urinating a lot light in color  6.  Tell me what your incisions look like?no problems,  7.  Have you been passing gas? BM?passing gas and had several bms  8.  If a problem or question were to arise who would you call?  Do you know contact numbers for [facility], [facility], and [facility]?aware of how to contact all services  9.  How has the walking going?walking frequently, been to mall since  10.  How are your vitamins and calcium going?  How are you taking them?mvi schedule is working for patient and so is calcium.

## 2018-08-04 NOTE — Discharge Summary (Signed)
  Patient ID: Gabrielle Golden [id number] 32 y.o. 1985-09-09  07/21/2018  Discharge date and time: 07/22/2018   Admitting Physician: Mariella Saa  Discharge Physician: Mariella Saa  Admission Diagnoses: Morbid obesity  Discharge Diagnoses: Same  Operations: Procedure(s): LAPAROSCOPIC GASTRIC SLEEVE RESECTION WITH UPPER ENDO AND ERAS PATHWAY  Admission Condition: fair  Discharged Condition: fair  Indication for Admission: Patient with progressive morbid obesity unresponsive to multiple efforts at medical management who presents with a BMI of 60.  After extensive preoperative work-up and discussion detailed elsewhere we have elected to proceed with laparoscopic sleeve gastrectomy for treatment of her morbid obesity.  Hospital Course: On the morning of admission the patient underwent an uneventful laparoscopic sleeve gastrectomy.  In the PACU immediately after surgery she was noted to have some persistent hypoxia.  Chest x-ray showed possible perihilar edema and concern of barotrauma.  Differential of hypoventilation as well.  She did not require reintubation and remained stable.  She was observed overnight in the stepdown unit.  Her oxygen saturations quickly improved.  The following morning she was just starting to take some fluids with minimal discomfort.  Oxygen saturations were low 90s on room air.  She was observed through this first postoperative day and steadily improved oral intake, was ambulatory, minimal pain with O2 saturations in the 90s and was felt ready for discharge on postop day 1.   Disposition: Home  Patient Instructions:  Allergies as of 07/22/2018      Reactions   Septra [sulfamethoxazole-trimethoprim] Hives      Medication List    TAKE these medications   oxyCODONE 5 MG/5ML solution Commonly known as:  ROXICODONE Take 5 mLs (5 mg total) by mouth every 4 (four) hours as needed for moderate pain or severe pain.       Activity: activity as  tolerated Diet: Bariatric protein shakes Wound Care: none needed  Follow-up:  With Dr. Johna Sheriff in 3 weeks.  Signed: Mariella Saa MD, FACS  08/04/2018, 12:43 PM

## 2018-08-05 ENCOUNTER — Encounter: Payer: 59 | Attending: General Surgery | Admitting: Skilled Nursing Facility1

## 2018-08-05 DIAGNOSIS — Z6841 Body Mass Index (BMI) 40.0 and over, adult: Secondary | ICD-10-CM | POA: Insufficient documentation

## 2018-08-07 NOTE — Progress Notes (Signed)
Bariatric Class:  Appt start time: 1530 end time:  1630.  2 Week Post-Operative Nutrition Class  Patient was seen on 08/05/18 for Post-Operative Nutrition education at [facility].   Surgery date: 0210/20 Surgery type: Sleeve  Start weight at [facility]: 397.1 Weight today: 378.8  Body Composition Scale  Total Body Fat:  51.4%  Visceral Fat: 19  Fat-Free Mass:  48.5 %   Total Body Water:   38.7%  Muscle-Mass: 36.8 lbs  Body Fat Displacement:  Torso: 121.1  lbs Left Leg: 24.2 lbs Right Leg:  24.2lbs Left Arm:  12.1lbs Right Arm:  12.1 lbs    The following the learning objectives were met by the patient during this course:  Identifies Phase 3A (Soft, High Proteins) Dietary Goals and will begin from 2 weeks post-operatively to 2 months post-operatively  Identifies appropriate sources of fluids and proteins   States protein recommendations and appropriate sources post-operatively  Identifies the need for appropriate texture modifications, mastication, and bite sizes when consuming solids  Identifies appropriate multivitamin and calcium sources post-operatively  Describes the need for physical activity post-operatively and will follow MD recommendations  States when to call healthcare provider regarding medication questions or post-operative complications  Handouts given during class include:  Phase 3A: Soft, High Protein Diet Handout  Follow-Up Plan: Patient will follow-up at [facility] in 6 weeks for 2 month post-op nutrition visit for diet advancement per MD.

## 2018-08-12 ENCOUNTER — Telehealth: Payer: Self-pay | Admitting: Skilled Nursing Facility1

## 2018-08-12 NOTE — Telephone Encounter (Signed)
RD called pt to verify fluid intake once starting soft, solid proteins 2 week post-bariatric surgery.   Daily Fluid intake: 64 oz Daily Protein intake: 60g  Concerns/issues:   N/A

## 2018-09-16 ENCOUNTER — Ambulatory Visit: Payer: 59 | Admitting: Skilled Nursing Facility1

## 2018-09-17 ENCOUNTER — Encounter: Payer: 59 | Attending: General Surgery | Admitting: Skilled Nursing Facility1

## 2018-09-17 ENCOUNTER — Other Ambulatory Visit: Payer: Self-pay

## 2018-09-17 DIAGNOSIS — Z6841 Body Mass Index (BMI) 40.0 and over, adult: Secondary | ICD-10-CM | POA: Diagnosis present

## 2018-09-17 NOTE — Patient Instructions (Addendum)
-  Continue to aim to drink 64 fluid ounces a day: try cinnamon stick, lemon, basil, mint, frozen fruits, cucumbers, any other herb  -Switch to the bariatric advantage ultra solo with iron capsule have food on your stomach before you take it taking it later in the day  -Continue to not clean your plate; save the rest for a snack later   -Drink your protein shake within 30 minutes and then star drinking your water  -Eat non-starchy vegetables 2 times a day 7 days a week  -Start out with soft cooked vegetables today and tomorrow; if tolerated begin to eat raw vegetables or cooked including salads  -Eat your 3 ounces of protein first then start in on your non-starchy vegetables; once you understand how much of your meal leads to satisfaction and not full while still eating 3 ounces of protein and non-starchy vegetables you can eat them in any order   -Continue to aim for 30 minutes of activity at least 5 times a week  -Do NOT cook with/add to your food: alfredo sauce, cheese sauce, barbeque sauce, ketchup, fat back, butter, bacon grease, grease, Crisco

## 2018-09-17 NOTE — Progress Notes (Signed)
Bariatric Follow-Up Visit  Medical Nutrition Therapy  Appt Start Time: 3:15 End Time: 3:47 Primary Concerns Today: Bariatric Surgery Nutrition Follow Up   Bariatric Surgery Type:  Sleeve  Surgery Date: 07/21/2018   NUTRITION ASSESSMENT   Anthropometrics  Start weight at [facility]: 397.1  lbs  Today's weight: 361.7 lbs Weight change:  17.1 lbs (since previous nutrition appointment)   Body Composition Results  Total Body Fat:  51.4%    50.6  Visceral Fat: 19   19  Fat-Free Mass:  48.5 %    49.3   Total Body Water:   38.7%    39.1  Muscle-Mass: 36.8 lbs    36.6  Body Fat Displacement:  Torso: 121.1  Lbs      113.8 Left Leg: 24.2 lbs     22.7 Right Leg:  24.2lbs       22.7 Left Arm:  12.1lbs        11.3 Right Arm:  12.1 lbs      11.3    Psychosocial/Lifestyle  Pt states things have been decent with days struggling to get in 64 fluid ounces every day. Pt states she has been getting more towards 80 grams of protein on Dr. Johna SheriffHoxworth recommendations. Pt states struggles to get in enough fluid because she can only sip which is frustrating causing her to give up. Pt states she has thrown up a 3 times due to eating too fast and too much at once and once eating and drinking at the same time. Pt states she still has to learn the new habit of not having to finish everything on her plate. Pt states she is starting to realize satisfied does not equal full.   Medications: N/A  Labs:  Supplements:  bari advantage and calcium   24-Hr Dietary Recall First Meal: 1.5 eggs with [place] sausage  Snack: 1 premier protein shake (taking 1 hour to drink) Second Meal: 2 meat and cheese roll ups Snack:  Third Meal: chicken wings with edamame or turky burger no bun Snack:  Beverages: plain water   Estimated Daily Fluid Intake:  40 oz Estimated Daily Protein Intake:  75 g   Physical Activity  Current average weekly physical activity: 500 jump ropes a day: leg resistance Monday-Friday and enjoying  it     Signs/Symptoms  Using straws: no Drinking while eating: sometimes with vomit Chewing/swallowing difficulties: no Changes in vision: no Changes to mood/headaches: no Hair loss/changes to skin/nails: no Difficulty focusing/concentrating: no Sweating: no Dizziness/lightheadedness: no Palpitations: no Carbonated/caffeinated beverages: no N/V/D/C/Gas: no Abdominal pain: no Dumping syndrome: no     NUTRITION DIAGNOSIS  Overweight/obesity ([place]-3.3) related to past poor dietary habits and physical inactivity as evidenced by patient w/ completed Bariatric surgery following dietary guidelines for continued weight loss and healthy nutrition status.     NUTRITION INTERVENTION Nutrition counseling (C-1) and education (E-2) to facilitate bariatric surgery goals, including:  Diet advancement to the next phase now including non-starchy vegetables  The importance of consuming adequate calories as well as certain nutrients daily due to the body's need for essential vitamins, minerals, and fats  The importance of daily physical activity and to reach a goal of at least 150 minutes of moderate to vigorous physical activity weekly (or as directed by their physician) due to benefits such as increased musculature and improved lab values  Pt Chosen Goals: -Do not clean your plate; save the rest for a snack later  -Continue to aim to drink 64 fluid ounces a day:  try cinnamon stick, lemon, basil, mint, frozen fruits, cucumbers, any other herb -Switch to the bariatric advantage ultra solo with iron capsule have food on your stomach before you take it taking it later in the day -Continue to not clean your plate; save the rest for a snack later  -Drink your protein shake within 30 minutes and then star drinking your water -Eat non-starchy vegetables 2 times a day 7 days a week -Start out with soft cooked vegetables today and tomorrow; if tolerated begin to eat raw vegetables or cooked including  salads -Eat your 3 ounces of protein first then start in on your non-starchy vegetables; once you understand how much of your meal leads to satisfaction and not full while still eating 3 ounces of protein and non-starchy vegetables you can eat them in any order  -Continue to aim for 30 minutes of activity at least 5 times a week -Do NOT cook with/add to your food: alfredo sauce, cheese sauce, barbeque sauce, ketchup, fat back, butter, bacon grease, grease, Crisco   Handouts Provided Include  Non starchy veggies + protein    Readiness for Change: Activly Working towards her goals  Demonstrated degree of understanding via: Teach Back       MONITORING & EVALUATION Dietary intake, weekly physical activity, and body weight follow up in 2 months

## 2018-11-19 ENCOUNTER — Encounter: Payer: 59 | Attending: General Surgery | Admitting: Skilled Nursing Facility1

## 2018-11-19 ENCOUNTER — Other Ambulatory Visit: Payer: Self-pay

## 2018-11-19 DIAGNOSIS — Z6841 Body Mass Index (BMI) 40.0 and over, adult: Secondary | ICD-10-CM | POA: Diagnosis present

## 2018-11-19 NOTE — Patient Instructions (Addendum)
-  Start with the first goal of 70 fluid ounces minimum   -Aim for non starchy vegetables 2 times a day 7 days a week   -Add in potato, corn, peas sticking to 1/2 cup or less usually more around 1/3-1/4 cup: once a day would be good  -Eat the protein and non starchy vegetable first

## 2018-11-19 NOTE — Progress Notes (Signed)
Bariatric Follow-Up Visit  Medical Nutrition Therapy  Appt Start Time: 3:15 End Time: 3:47 Primary Concerns Today: Bariatric Surgery Nutrition Follow Up   Bariatric Surgery Type:  Sleeve  Surgery Date: 07/21/2018   NUTRITION ASSESSMENT   Anthropometrics  Start weight at [facility]: 397.1  lbs  Today's weight: 332 lbs  Weight change:  30 lbs (since previous nutrition appointment)   Body Composition printer not printing: 49% fat; 39.9 % water    Psychosocial/Lifestyle  Pt states she has started taking an apple cider vinegar. Pt states she no longer eats beef and pork. Pt states she sometimes skips a meal due to being busy at work. Pt sate she has a goal of getting in 90 ounces of fluid but rarely hit that. Pt states sometimes she just doe not want to eat. Pt states she is kind of waiting for the other shoe to drop.    Medications: N/A  Labs:  Supplements:  bari advantage and calcium   24-Hr Dietary Recall: 1 protein shake every other day; 2-3 meals a day First Meal: 1.5 eggs with [place] sausage  Snack: 1 premier protein shake (taking 1 hour to drink) Second Meal: 2 meat and cheese roll ups or greek yogurt Snack: yogurt or meat and cheese roll up Third Meal: chicken wings with edamame or turky burger no bun or 4oz of salmon with cauliflower with pease and carrots  Snack:  Beverages: plain water   Estimated Daily Fluid Intake:  64 oz Estimated Daily Protein Intake:  75 g   Physical Activity  Current average weekly physical activity: 500 jump ropes a day: leg resistance Monday-Friday and enjoying it; walking 4 miles a day or youtube video     Signs/Symptoms  Using straws: no Drinking while eating: sometimes with vomit Chewing/swallowing difficulties: no Changes in vision: no Changes to mood/headaches: no Hair loss/changes to skin/nails: no Difficulty focusing/concentrating: no Sweating: no Dizziness/lightheadedness: no Palpitations: no Carbonated/caffeinated beverages:  no N/V/D/C/Gas: no Abdominal pain: no Dumping syndrome: no     NUTRITION DIAGNOSIS  Overweight/obesity (-3.3) related to past poor dietary habits and physical inactivity as evidenced by patient w/ completed Bariatric surgery following dietary guidelines for continued weight loss and healthy nutrition status.     NUTRITION INTERVENTION Nutrition counseling (C-1) and education (E-2) to facilitate bariatric surgery goals, including:  Diet advancement to the next phase now including non-starchy vegetables  The importance of consuming adequate calories as well as certain nutrients daily due to the body's need for essential vitamins, minerals, and fats  The importance of daily physical activity and to reach a goal of at least 150 minutes of moderate to vigorous physical activity weekly (or as directed by their physician) due to benefits such as increased musculature and improved lab values  Pt Chosen Goals: -Do not clean your plate; save the rest for a snack later  -Start with the first goal of 70 fluid ounces minimum   -Aim for non starchy vegetables 2 times a day 7 days a week   -Add in potato, corn, peas sticking to 1/2 cup or less usually more around 1/3-1/4 cup: once a day would be good  -Eat the protein and non starchy vegetable first   Handouts Provided Include  Non starchy veggies + protein    Readiness for Change: Activly Working towards her goals  Demonstrated degree of understanding via: Teach Back       MONITORING & EVALUATION Dietary intake, weekly physical activity, and body weight follow up in 2  months

## 2018-12-14 ENCOUNTER — Ambulatory Visit (INDEPENDENT_AMBULATORY_CARE_PROVIDER_SITE_OTHER): Payer: Self-pay

## 2018-12-14 ENCOUNTER — Other Ambulatory Visit: Payer: Self-pay

## 2018-12-14 ENCOUNTER — Encounter (HOSPITAL_COMMUNITY): Payer: Self-pay

## 2018-12-14 ENCOUNTER — Ambulatory Visit (HOSPITAL_COMMUNITY)
Admission: EM | Admit: 2018-12-14 | Discharge: 2018-12-14 | Disposition: A | Discharge status: Home / Self Care | Payer: Self-pay | Attending: Family Medicine | Admitting: Family Medicine

## 2018-12-14 ENCOUNTER — Ambulatory Visit (INDEPENDENT_AMBULATORY_CARE_PROVIDER_SITE_OTHER): Payer: 59

## 2018-12-14 DIAGNOSIS — Z3202 Encounter for pregnancy test, result negative: Secondary | ICD-10-CM

## 2018-12-14 DIAGNOSIS — T148XXA Other injury of unspecified body region, initial encounter: Secondary | ICD-10-CM | POA: Diagnosis not present

## 2018-12-14 DIAGNOSIS — Z9884 Bariatric surgery status: Secondary | ICD-10-CM

## 2018-12-14 LAB — POCT PREGNANCY, URINE: Preg Test, Ur: NEGATIVE
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
## 2018-12-14 NOTE — Discharge Instructions (Addendum)
Your x-rays are normal today.  You will be sore in your muscles tomorrow for the next few days.  Take Tylenol as needed for the pain.  Go to the emergency room if you develop worsening pain, headache, weakness, numbness, confusion, vomiting, shortness of breath, chest pain, or other concerning symptoms.
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
## 2019-02-04 ENCOUNTER — Ambulatory Visit: Payer: 59 | Admitting: Skilled Nursing Facility1

## 2019-08-24 DIAGNOSIS — F4323 Adjustment disorder with mixed anxiety and depressed mood: Secondary | ICD-10-CM | POA: Diagnosis not present

## 2019-09-06 IMAGING — RF DG UGI W/ HIGH DENSITY W/KUB
11 of 18 series · 13 of 24 positions shown · non-contrast
Comparison: None.

CLINICAL DATA: 31-year-old female undergoing preoperative
evaluation for bariatric surgery.

EXAM:
UPPER GI SERIES WITH KUB
TECHNIQUE: After obtaining a scout radiograph a routine upper GI series was
performed using barium
FLUOROSCOPY TIME:  Fluoroscopy Time:  1 minutes 48 seconds
Radiation Exposure Index (if provided by the fluoroscopic device):
69 mGy
Number of Acquired Spot Images: 0

[Series 3: t abdomen supine · 0.15mm/px · 1 of 1 slices shown]
[im 1/1]
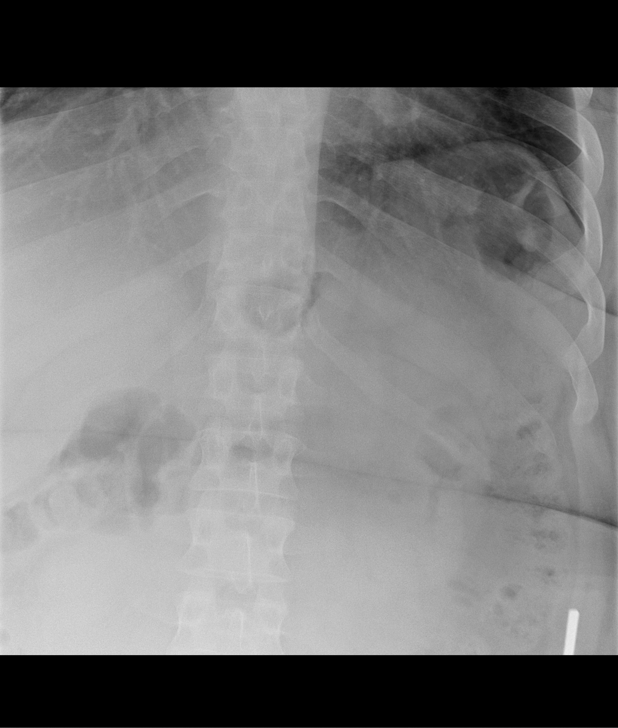

[Series 4: cp_standard · 0.51mm/px · 1 of 35 frames shown (1 of 10)]
[frame 18/35]
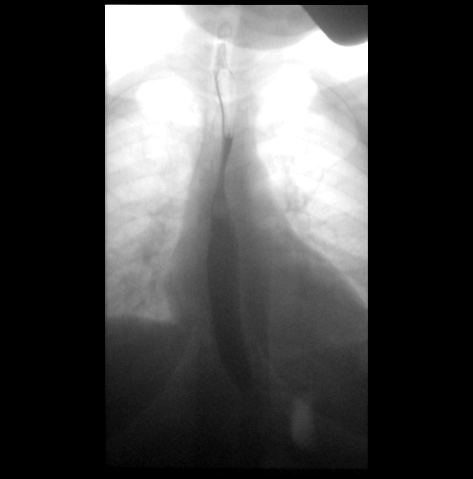

[Series 6: cp_standard · 0.51mm/px · 2 of 20 frames shown (2 of 10)]
[frame 3/20]
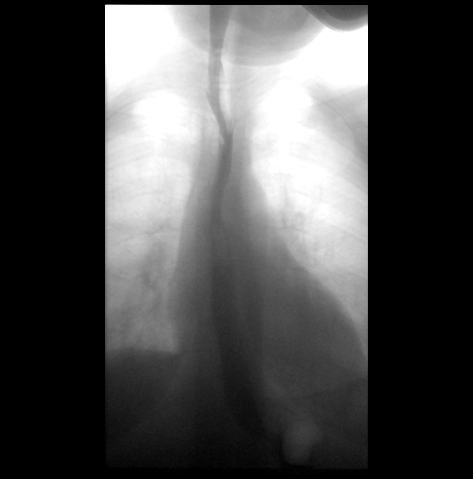
[frame 11/20]
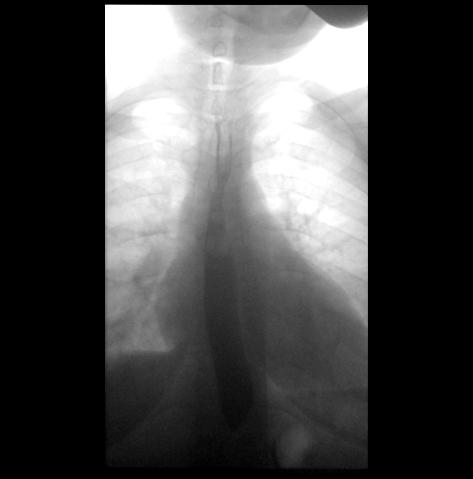

[Series 8: cp_standard · 0.17mm/px · 1 of 1 slices shown (3 of 10)]
[im 1/1]
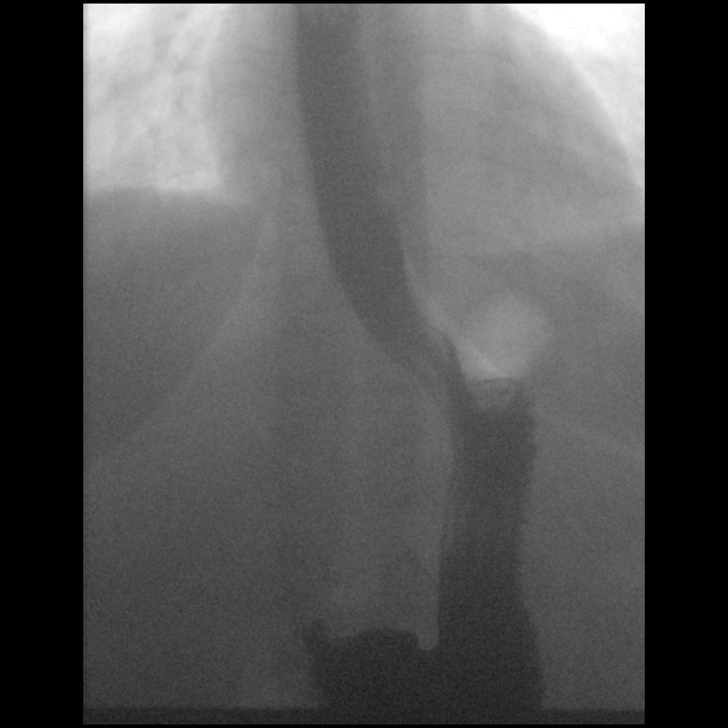

[Series 11: cp_standard · 0.17mm/px · 1 of 1 slices shown (4 of 10)]
[im 1/1]
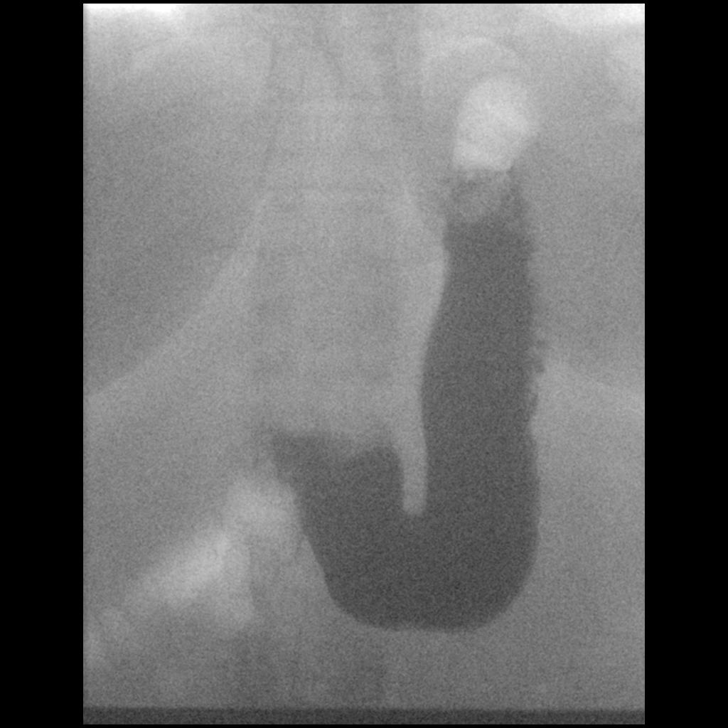

[Series 14: cp_standard · 0.17mm/px · 1 of 1 slices shown (5 of 10)]
[im 1/1]
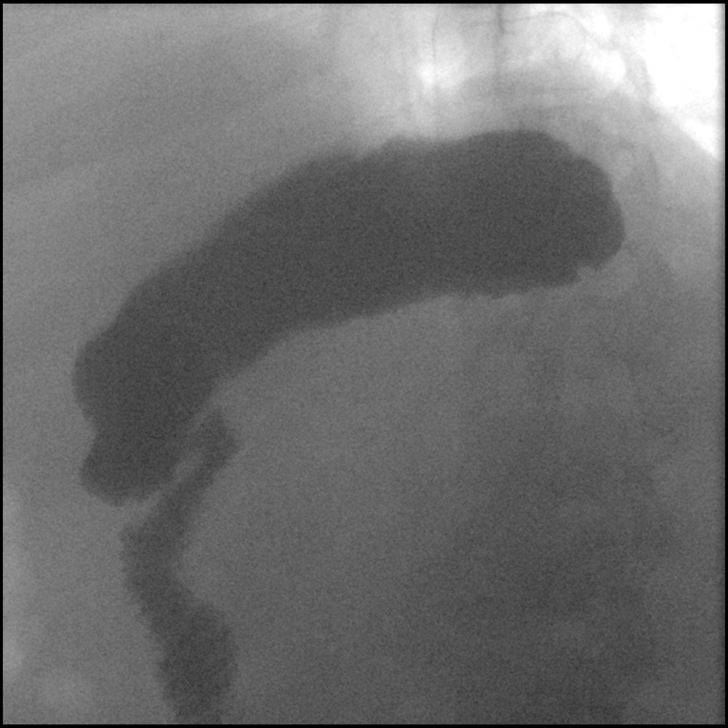

[Series 15: cp_standard · 0.17mm/px · 1 of 1 slices shown (6 of 10)]
[im 1/1]
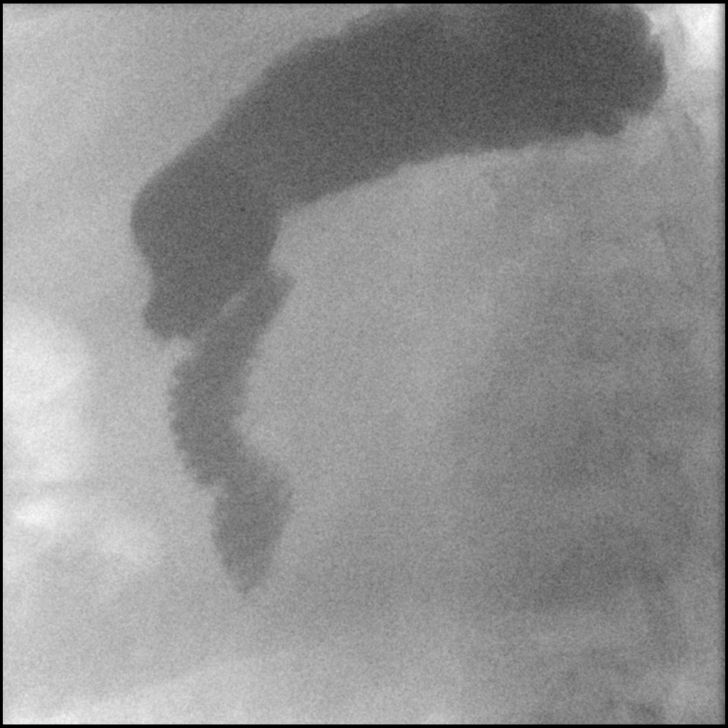

[Series 18: cp_standard · 0.17mm/px · 1 of 1 slices shown (7 of 10)]
[im 1/1]
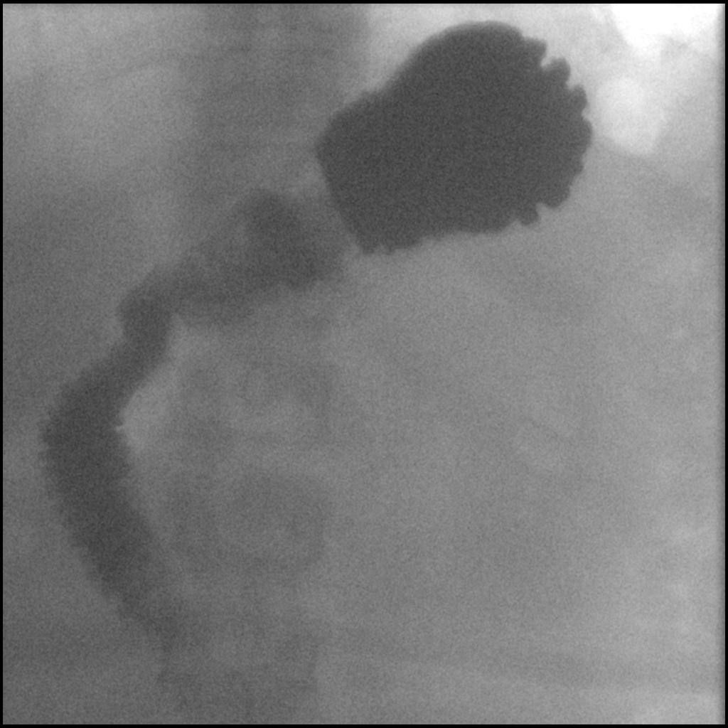

[Series 20: cp_standard · 0.50mm/px · 2 of 19 frames shown (8 of 10)]
[frame 10/19]
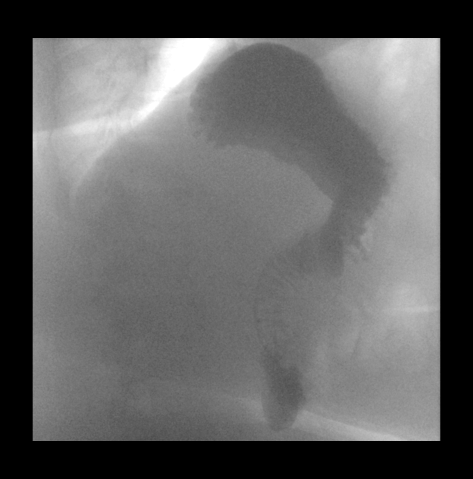
[frame 19/19]
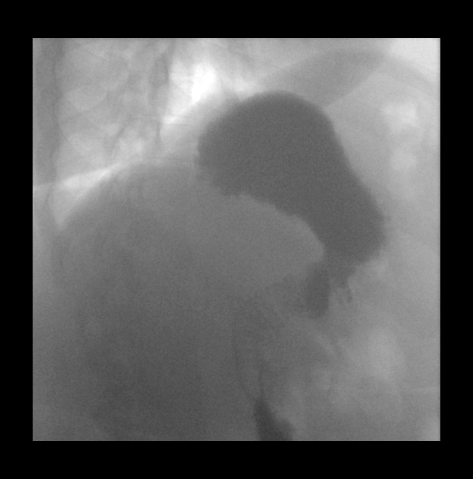

[Series 23: cp_standard · 0.25mm/px · 1 of 1 slices shown (9 of 10)]
[im 1/1]
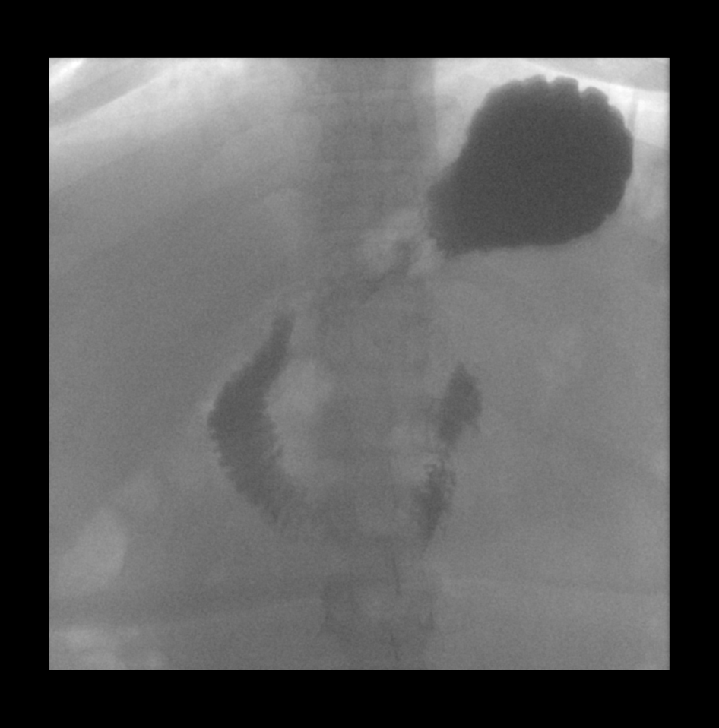

[Series 26: cp_standard · 0.25mm/px · 1 of 1 slices shown (10 of 10)]
[im 1/1]
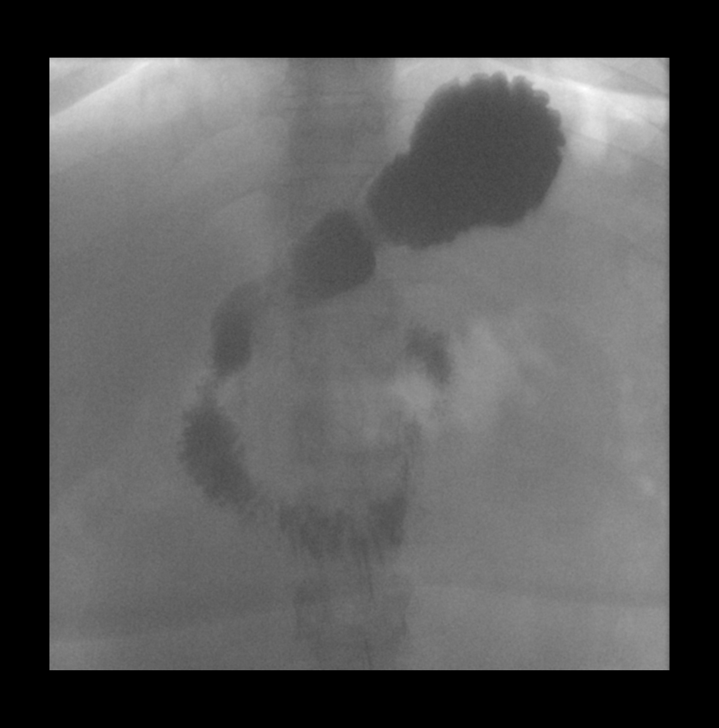

[13 of 24 positions shown; findings below may reference images not displayed]

FINDINGS: Preprocedural scout view of the abdomen. Normal bowel gas pattern.
Negative visible lung bases. No osseous abnormality identified.

No obstruction to the forward flow of contrast throughout the
esophagus and into the stomach. Normal esophageal course and
contour.

Normal gastroesophageal junction. Normal gastric contour. Prompt
gastric emptying.

Normal duodenum C-loop configuration. Normal ligament of Treitz
position (series 27). The duodenum mucosal pattern appears normal.

No gastroesophageal reflux occurred spontaneously or was elicited.
IMPRESSION: Normal upper GI study.

## 2019-09-16 DIAGNOSIS — F4323 Adjustment disorder with mixed anxiety and depressed mood: Secondary | ICD-10-CM | POA: Diagnosis not present

## 2019-09-30 DIAGNOSIS — F4323 Adjustment disorder with mixed anxiety and depressed mood: Secondary | ICD-10-CM | POA: Diagnosis not present

## 2019-10-05 ENCOUNTER — Encounter: Payer: Self-pay | Attending: Surgery | Admitting: Skilled Nursing Facility1

## 2019-10-05 ENCOUNTER — Other Ambulatory Visit: Payer: Self-pay

## 2019-10-05 DIAGNOSIS — Z6841 Body Mass Index (BMI) 40.0 and over, adult: Secondary | ICD-10-CM | POA: Insufficient documentation

## 2019-10-05 NOTE — Patient Instructions (Signed)
-  Get back to grocery shopping once a week and meal prepping 2 times a week

## 2019-10-05 NOTE — Progress Notes (Signed)
Bariatric Follow-Up Visit  Medical Nutrition Therapy   Primary Concerns Today: Bariatric Surgery Nutrition Follow Up   Bariatric Surgery Type:  Sleeve  Surgery Date: 07/21/2018   NUTRITION ASSESSMENT   Anthropometrics  Start weight at [facility]: 397.1  lbs  Today's weight: 302 lbs  Weight change: 30 lbs (since previous nutrition appointment)    Psychosocial/Lifestyle  Pt states she just wanted to check in. Pt states she wants to switch ot a cheaper multi: advised to take opurity or procare. Pt states she is waiting too long to eat and eats too much and the wrong thing. Pt states she had been working 7 days a week and working 2 jobs but realized her health was suffering. Pt states she wants to be a better athlete. Pt states she does not buy snack because she will only eat those. Pt states she feels tired often stating she gets 6 hours continuous restful sleep. Pt states she has not been eating complex carbohydrates due to fear.   Body Composition Scale 10/05/2019  Current Body Weight 302.6  Total Body Fat % 47  Visceral Fat 14  Fat-Free Mass % 52.9   Total Body Water % 40.9  Muscle-Mass lbs 36  BMI 47.1  Body Fat Displacement          Torso  lbs 88.3         Left Leg  lbs 17.6         Right Leg  lbs 17.6         Left Arm  lbs 8.8         Right Arm   lbs 8.8    Supplements:  bari advantage and calcium   24-Hr Dietary Recall: 1 protein shake every other day; 2-3 meals a day First Meal: 1.5 eggs with [place] sausage  Snack: 1 premier protein shake  Second Meal: 2 meat and cheese roll ups or greek yogurt Snack: yogurt or meat and cheese roll up Third Meal: chicken wings with edamame or turky burger no bun or 4oz of salmon with cauliflower with pease and carrots  Snack:  Beverages: plain water   Estimated Daily Fluid Intake:  125+ oz Estimated Daily Protein Intake:  75 g   Physical Activity  Current average weekly physical activity: 3 times a week class, strength training      Signs/Symptoms  Using straws: no Drinking while eating: sometimes with vomit Chewing/swallowing difficulties: no Changes in vision: no Changes to mood/headaches: no Hair loss/changes to skin/nails: no Difficulty focusing/concentrating: no Sweating: no Dizziness/lightheadedness: no Palpitations: no Carbonated/caffeinated beverages: no N/V/D/C/Gas: no Abdominal pain: no Dumping syndrome: no     NUTRITION DIAGNOSIS  Overweight/obesity ([place]-3.3) related to past poor dietary habits and physical inactivity as evidenced by patient w/ completed Bariatric surgery following dietary guidelines for continued weight loss and healthy nutrition status.     NUTRITION INTERVENTION Nutrition counseling (C-1) and education (E-2) to facilitate bariatric surgery goals, including:  The importance of consuming adequate calories as well as certain nutrients daily due to the body's need for essential vitamins, minerals, and fats  The importance of daily physical activity and to reach a goal of at least 150 minutes of moderate to vigorous physical activity weekly (or as directed by their physician) due to benefits such as increased musculature and improved lab values  Pt Chosen Goals: -Cut water down to 100 ounces -Add complex carbs into your routine following portion sizes  -Get back to grocery shopping once a week and meal  prepping 2 times a week  -do not go longer than 5 hours without eating; work on [employer] Provided Include  Detailed MyPlate   Readiness for Change: Actively Working towards her goals  Demonstrated degree of understanding via: Teach Back       MONITORING & EVALUATION Dietary intake, weekly physical activity, and body weight follow up in 6 months

## 2019-10-12 DIAGNOSIS — Z20828 Contact with and (suspected) exposure to other viral communicable diseases: Secondary | ICD-10-CM | POA: Diagnosis not present

## 2019-10-12 DIAGNOSIS — N760 Acute vaginitis: Secondary | ICD-10-CM | POA: Diagnosis not present

## 2019-10-12 DIAGNOSIS — F4323 Adjustment disorder with mixed anxiety and depressed mood: Secondary | ICD-10-CM | POA: Diagnosis not present

## 2019-10-12 DIAGNOSIS — Z113 Encounter for screening for infections with a predominantly sexual mode of transmission: Secondary | ICD-10-CM | POA: Diagnosis not present

## 2019-10-20 DIAGNOSIS — Z03818 Encounter for observation for suspected exposure to other biological agents ruled out: Secondary | ICD-10-CM | POA: Diagnosis not present

## 2019-10-20 DIAGNOSIS — Z20828 Contact with and (suspected) exposure to other viral communicable diseases: Secondary | ICD-10-CM | POA: Diagnosis not present

## 2019-11-04 DIAGNOSIS — F4323 Adjustment disorder with mixed anxiety and depressed mood: Secondary | ICD-10-CM | POA: Diagnosis not present

## 2019-11-10 DIAGNOSIS — Z01419 Encounter for gynecological examination (general) (routine) without abnormal findings: Secondary | ICD-10-CM | POA: Diagnosis not present

## 2019-11-18 DIAGNOSIS — F4323 Adjustment disorder with mixed anxiety and depressed mood: Secondary | ICD-10-CM | POA: Diagnosis not present

## 2019-12-02 DIAGNOSIS — F4323 Adjustment disorder with mixed anxiety and depressed mood: Secondary | ICD-10-CM | POA: Diagnosis not present

## 2020-02-05 ENCOUNTER — Encounter (HOSPITAL_COMMUNITY): Payer: Self-pay

## 2020-04-04 ENCOUNTER — Ambulatory Visit: Payer: Self-pay | Admitting: Skilled Nursing Facility1

## 2020-11-10 ENCOUNTER — Other Ambulatory Visit: Payer: Self-pay | Admitting: Nurse Practitioner

## 2020-11-10 DIAGNOSIS — N6320 Unspecified lump in the left breast, unspecified quadrant: Secondary | ICD-10-CM

## 2020-12-26 ENCOUNTER — Other Ambulatory Visit: Payer: Self-pay

## 2020-12-26 ENCOUNTER — Ambulatory Visit
Admission: RE | Admit: 2020-12-26 | Discharge: 2020-12-26 | Disposition: A | Discharge status: Home / Self Care | Payer: Managed Care, Other (non HMO) | Source: Ambulatory Visit | Attending: Nurse Practitioner | Admitting: Nurse Practitioner

## 2020-12-26 ENCOUNTER — Other Ambulatory Visit: Payer: Self-pay | Admitting: Nurse Practitioner

## 2020-12-26 DIAGNOSIS — N6320 Unspecified lump in the left breast, unspecified quadrant: Secondary | ICD-10-CM

## 2021-02-16 ENCOUNTER — Encounter (HOSPITAL_COMMUNITY): Payer: Self-pay | Admitting: *Deleted

## 2021-03-30 ENCOUNTER — Encounter (HOSPITAL_COMMUNITY): Payer: Self-pay | Admitting: *Deleted
